# Patient Record
Sex: Female | Born: 1985 | Race: White | Hispanic: No | Marital: Single | Smoking: Never smoker
Health system: Southern US, Community
[De-identification: ages and names within clinical notes are randomized; demographics above are authoritative.]

## PROBLEM LIST (undated history)

## (undated) ENCOUNTER — Ambulatory Visit: Admission: EM | Payer: BC Managed Care – PPO | Source: Home / Self Care

## (undated) DIAGNOSIS — C8370 Burkitt lymphoma, unspecified site: Secondary | ICD-10-CM

## (undated) HISTORY — DX: Burkitt lymphoma, unspecified site: C83.70

## (undated) HISTORY — PX: PARTIAL COLECTOMY: SHX5273

---
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
## 2007-03-18 ENCOUNTER — Other Ambulatory Visit: Admission: RE | Admit: 2007-03-18 | Discharge: 2007-03-18 | Payer: Self-pay | Admitting: Gynecology

## 2008-03-21 ENCOUNTER — Other Ambulatory Visit: Admission: RE | Admit: 2008-03-21 | Discharge: 2008-03-21 | Payer: Self-pay | Admitting: Gynecology

## 2011-01-25 NOTE — Discharge Summary (Signed)
NAME:  Mikayla Salazar, Mikayla Salazar               ACCOUNT NO.:  [id number]   MEDICAL RECORD NO.:  [id number]          PATIENT TYPE:  INP   LOCATION:  6120                         FACILITY:  [facility]   PHYSICIAN:  Broadus John T. Pickard II, MDDATE OF BIRTH:  1986-02-20   DATE OF ADMISSION:  09/07/2004  DATE OF DISCHARGE:  09/10/2004                                 DISCHARGE SUMMARY   DISCHARGE DIAGNOSES:  1.  Right upper lobe/left lingular pneumonia.  2.  Nausea and vomiting secondary to diagnosis #1.  3.  High risk with associated psychosocial stress secondary to parents'      divorce.  4.  Dehydration.   PROCEDURE:  Chest x-ray showed right upper lobe and lingular pneumonia.   LABORATORY DATA:  None.   HOSPITAL COURSE:  A 25 year old white female admitted with fever and  decreased p.o. intake and dehydration with chest x-ray suspicious for  pneumonia.  The patient was treated with ceftriaxone x2 days.  Patient's  fever, nausea and vomiting improved with IV fluids and IV antibiotics.  Patient was symptomatically improved by the day of discharge.   TREATMENT:  The patient received inpatient ceftriaxone, IV fluids and p.r.n.  Phenergan and Zofran.   DISCHARGE MEDICATIONS:  1.  Omnicef 600 mg p.o. daily x7 days.  2.  Tylenol and Motrin p.r.n.   FOLLOW UP:  The patient is instructed to follow up with Dr. Hewitt Blade  office Thursday, September 13, 2004, at 10:40 in the morning.  We would  appreciate if Dr. Peter Congo would follow the patient's resolution of her  pneumonia as well as potentially arrange any outpatient child psychology  appointments with Dr. Lindie Spruce given her parents' recent divorce, her father's  possible drug abuse, her possible social activity with a 16 year old man,  etc. and her marijuana use.       WTP/MEDQ  D:  09/10/2004  T:  09/10/2004  Job:  [id number]

## 2015-11-07 ENCOUNTER — Other Ambulatory Visit: Payer: Self-pay | Admitting: Family Medicine

## 2015-11-07 DIAGNOSIS — E042 Nontoxic multinodular goiter: Secondary | ICD-10-CM

## 2015-11-20 ENCOUNTER — Ambulatory Visit
Admission: RE | Admit: 2015-11-20 | Discharge: 2015-11-20 | Disposition: A | Discharge status: Home / Self Care | Payer: BLUE CROSS/BLUE SHIELD | Source: Ambulatory Visit | Attending: Family Medicine | Admitting: Family Medicine

## 2015-11-20 DIAGNOSIS — E042 Nontoxic multinodular goiter: Secondary | ICD-10-CM

## 2015-12-08 ENCOUNTER — Other Ambulatory Visit: Payer: BLUE CROSS/BLUE SHIELD

## 2015-12-08 ENCOUNTER — Ambulatory Visit
Admission: RE | Admit: 2015-12-08 | Discharge: 2015-12-08 | Disposition: A | Discharge status: Home / Self Care | Payer: BLUE CROSS/BLUE SHIELD | Source: Ambulatory Visit | Attending: Family Medicine | Admitting: Family Medicine

## 2015-12-08 ENCOUNTER — Other Ambulatory Visit: Payer: Self-pay | Admitting: Family Medicine

## 2015-12-08 DIAGNOSIS — R222 Localized swelling, mass and lump, trunk: Secondary | ICD-10-CM

## 2017-08-18 ENCOUNTER — Other Ambulatory Visit: Payer: Self-pay

## 2017-08-18 ENCOUNTER — Encounter: Payer: Self-pay | Admitting: Certified Nurse Midwife

## 2017-08-18 ENCOUNTER — Inpatient Hospital Stay (HOSPITAL_COMMUNITY): Payer: BLUE CROSS/BLUE SHIELD | Admitting: Anesthesiology

## 2017-08-18 ENCOUNTER — Encounter (HOSPITAL_COMMUNITY)
Admission: AD | Disposition: A | Discharge status: Home / Self Care | Payer: Self-pay | Source: Ambulatory Visit | Attending: Obstetrics and Gynecology

## 2017-08-18 ENCOUNTER — Inpatient Hospital Stay (HOSPITAL_COMMUNITY)
Admission: AD | Admit: 2017-08-18 | Discharge: 2017-08-21 | DRG: 786 | Disposition: A | Discharge status: Home / Self Care | Payer: BLUE CROSS/BLUE SHIELD | Source: Ambulatory Visit | Attending: Obstetrics and Gynecology | Admitting: Obstetrics and Gynecology

## 2017-08-18 DIAGNOSIS — O42913 Preterm premature rupture of membranes, unspecified as to length of time between rupture and onset of labor, third trimester: Secondary | ICD-10-CM | POA: Diagnosis present

## 2017-08-18 DIAGNOSIS — Z3A32 32 weeks gestation of pregnancy: Secondary | ICD-10-CM

## 2017-08-18 DIAGNOSIS — O328XX0 Maternal care for other malpresentation of fetus, not applicable or unspecified: Principal | ICD-10-CM | POA: Diagnosis present

## 2017-08-18 DIAGNOSIS — O321XX0 Maternal care for breech presentation, not applicable or unspecified: Secondary | ICD-10-CM | POA: Diagnosis present

## 2017-08-18 LAB — CBC
HCT: 43.7 % (ref 36.0–46.0)
HEMATOCRIT: 39.6 % (ref 36.0–46.0)
HEMOGLOBIN: 14.5 g/dL (ref 12.0–15.0)
Hemoglobin: 13 g/dL (ref 12.0–15.0)
MCH: 32.7 pg (ref 26.0–34.0)
MCH: 32.5 pg (ref 26.0–34.0)
MCHC: 33.2 g/dL (ref 30.0–36.0)
MCHC: 32.8 g/dL (ref 30.0–36.0)
MCV: 98.6 fL (ref 78.0–100.0)
MCV: 99 fL (ref 78.0–100.0)
PLATELETS: 356 10*3/uL (ref 150–400)
RBC: 4.43 MIL/uL (ref 3.87–5.11)
RBC: 4 MIL/uL (ref 3.87–5.11)
RDW: 13 % (ref 11.5–15.5)
RDW: 12.8 % (ref 11.5–15.5)
WBC: 26 10*3/uL — AB (ref 4.0–10.5)

## 2017-08-18 SURGERY — Surgical Case
Anesthesia: Spinal | Site: Abdomen | Wound class: Clean Contaminated

## 2017-08-18 MED ORDER — BUPIVACAINE IN DEXTROSE 0.75-8.25 % IT SOLN
INTRATHECAL | Status: DC | PRN
  Administered 2017-08-18: 1.4 mL via INTRATHECAL

## 2017-08-18 MED ORDER — SIMETHICONE 80 MG PO CHEW
80.0000 mg | CHEWABLE_TABLET | ORAL | Status: DC
  Administered 2017-08-18 – 2017-08-21 (×3): 80 mg via ORAL
  Filled 2017-08-18 (×3): qty 1

## 2017-08-18 MED ORDER — FENTANYL CITRATE (PF) 100 MCG/2ML IJ SOLN
INTRAMUSCULAR | Status: AC
  Filled 2017-08-18: qty 2

## 2017-08-18 MED ORDER — PRENATAL MULTIVITAMIN CH
1.0000 | ORAL_TABLET | Freq: Every day | ORAL | Status: DC
  Administered 2017-08-19 – 2017-08-20 (×2): 1 via ORAL
  Filled 2017-08-18 (×2): qty 1

## 2017-08-18 MED ORDER — LACTATED RINGERS IV SOLN
INTRAVENOUS | Status: DC | PRN
  Administered 2017-08-18: 07:00:00 via INTRAVENOUS

## 2017-08-18 MED ORDER — ONDANSETRON HCL 4 MG/2ML IJ SOLN
4.0000 mg | Freq: Four times a day (QID) | INTRAMUSCULAR | Status: DC
  Administered 2017-08-18 – 2017-08-19 (×3): 4 mg via INTRAVENOUS
  Filled 2017-08-18 (×3): qty 2

## 2017-08-18 MED ORDER — OXYCODONE HCL 5 MG PO TABS
10.0000 mg | ORAL_TABLET | ORAL | Status: DC | PRN
  Administered 2017-08-18 – 2017-08-21 (×9): 10 mg via ORAL
  Filled 2017-08-18 (×9): qty 2

## 2017-08-18 MED ORDER — SODIUM CHLORIDE 0.9 % IR SOLN
Status: DC | PRN
  Administered 2017-08-18: 1

## 2017-08-18 MED ORDER — OXYCODONE HCL 5 MG/5ML PO SOLN: 5.0000 mg | Freq: Once | ORAL | Status: DC | PRN

## 2017-08-18 MED ORDER — OXYTOCIN 10 UNIT/ML IJ SOLN
INTRAVENOUS | Status: DC | PRN
  Administered 2017-08-18: 40 [IU] via INTRAVENOUS

## 2017-08-18 MED ORDER — COCONUT OIL OIL: 1.0000 "application " | TOPICAL_OIL | Status: DC | PRN

## 2017-08-18 MED ORDER — KETOROLAC TROMETHAMINE 30 MG/ML IJ SOLN
INTRAMUSCULAR | Status: AC
  Filled 2017-08-18: qty 1

## 2017-08-18 MED ORDER — OXYTOCIN 40 UNITS IN LACTATED RINGERS INFUSION - SIMPLE MED: 2.5000 [IU]/h | INTRAVENOUS | Status: AC

## 2017-08-18 MED ORDER — IBUPROFEN 600 MG PO TABS
600.0000 mg | ORAL_TABLET | Freq: Four times a day (QID) | ORAL | Status: DC
  Administered 2017-08-18 – 2017-08-21 (×9): 600 mg via ORAL
  Filled 2017-08-18 (×11): qty 1

## 2017-08-18 MED ORDER — PHENYLEPHRINE 8 MG IN D5W 100 ML (0.08MG/ML) PREMIX OPTIME
INJECTION | INTRAVENOUS | Status: AC
  Filled 2017-08-18: qty 100

## 2017-08-18 MED ORDER — ONDANSETRON HCL 4 MG/2ML IJ SOLN
INTRAMUSCULAR | Status: DC | PRN
  Administered 2017-08-18: 4 mg via INTRAVENOUS

## 2017-08-18 MED ORDER — OXYCODONE HCL 5 MG PO TABS: 5.0000 mg | ORAL_TABLET | Freq: Once | ORAL | Status: DC | PRN

## 2017-08-18 MED ORDER — OXYTOCIN 10 UNIT/ML IJ SOLN
INTRAMUSCULAR | Status: AC
  Filled 2017-08-18: qty 4

## 2017-08-18 MED ORDER — TETANUS-DIPHTH-ACELL PERTUSSIS 5-2.5-18.5 LF-MCG/0.5 IM SUSP: 0.5000 mL | Freq: Once | INTRAMUSCULAR | Status: DC

## 2017-08-18 MED ORDER — FENTANYL CITRATE (PF) 100 MCG/2ML IJ SOLN
INTRAMUSCULAR | Status: DC | PRN
  Administered 2017-08-18: 20 ug via INTRATHECAL

## 2017-08-18 MED ORDER — KETOROLAC TROMETHAMINE 30 MG/ML IJ SOLN
30.0000 mg | Freq: Four times a day (QID) | INTRAMUSCULAR | Status: AC | PRN
  Administered 2017-08-18: 30 mg via INTRAMUSCULAR

## 2017-08-18 MED ORDER — MORPHINE SULFATE (PF) 0.5 MG/ML IJ SOLN
INTRAMUSCULAR | Status: AC
Start: 2017-08-18 — End: 2017-08-18
  Filled 2017-08-18: qty 10

## 2017-08-18 MED ORDER — MENTHOL 3 MG MT LOZG: 1.0000 | LOZENGE | OROMUCOSAL | Status: DC | PRN

## 2017-08-18 MED ORDER — ACETAMINOPHEN 325 MG PO TABS
650.0000 mg | ORAL_TABLET | ORAL | Status: DC | PRN
  Administered 2017-08-20: 650 mg via ORAL
  Filled 2017-08-18: qty 2

## 2017-08-18 MED ORDER — WITCH HAZEL-GLYCERIN EX PADS: 1.0000 "application " | MEDICATED_PAD | CUTANEOUS | Status: DC | PRN

## 2017-08-18 MED ORDER — MEPERIDINE HCL 25 MG/ML IJ SOLN: 6.2500 mg | INTRAMUSCULAR | Status: DC | PRN

## 2017-08-18 MED ORDER — PHENYLEPHRINE 8 MG IN D5W 100 ML (0.08MG/ML) PREMIX OPTIME
INJECTION | INTRAVENOUS | Status: DC | PRN
  Administered 2017-08-18: 60 ug/min via INTRAVENOUS

## 2017-08-18 MED ORDER — ONDANSETRON HCL 4 MG/2ML IJ SOLN
INTRAMUSCULAR | Status: AC
  Filled 2017-08-18: qty 2

## 2017-08-18 MED ORDER — SIMETHICONE 80 MG PO CHEW: 80.0000 mg | CHEWABLE_TABLET | ORAL | Status: DC | PRN

## 2017-08-18 MED ORDER — MORPHINE SULFATE (PF) 0.5 MG/ML IJ SOLN
INTRAMUSCULAR | Status: DC | PRN
  Administered 2017-08-18: .2 mg via INTRATHECAL

## 2017-08-18 MED ORDER — SIMETHICONE 80 MG PO CHEW
80.0000 mg | CHEWABLE_TABLET | Freq: Three times a day (TID) | ORAL | Status: DC
  Administered 2017-08-19 – 2017-08-21 (×7): 80 mg via ORAL
  Filled 2017-08-18 (×7): qty 1

## 2017-08-18 MED ORDER — LACTATED RINGERS IV SOLN
INTRAVENOUS | Status: DC
  Administered 2017-08-18: 16:00:00 via INTRAVENOUS

## 2017-08-18 MED ORDER — LACTATED RINGERS IV BOLUS (SEPSIS)
500.0000 mL | Freq: Once | INTRAVENOUS | Status: AC
  Administered 2017-08-18: 500 mL via INTRAVENOUS

## 2017-08-18 MED ORDER — SENNOSIDES-DOCUSATE SODIUM 8.6-50 MG PO TABS
2.0000 | ORAL_TABLET | ORAL | Status: DC
  Administered 2017-08-18 – 2017-08-19 (×2): 2 via ORAL
  Filled 2017-08-18 (×3): qty 2

## 2017-08-18 MED ORDER — KETOROLAC TROMETHAMINE 30 MG/ML IJ SOLN
30.0000 mg | Freq: Four times a day (QID) | INTRAMUSCULAR | Status: AC | PRN
  Administered 2017-08-18: 30 mg via INTRAVENOUS
  Filled 2017-08-18: qty 1

## 2017-08-18 MED ORDER — DIPHENHYDRAMINE HCL 25 MG PO CAPS: 25.0000 mg | ORAL_CAPSULE | Freq: Four times a day (QID) | ORAL | Status: DC | PRN

## 2017-08-18 MED ORDER — OXYCODONE HCL 5 MG PO TABS
5.0000 mg | ORAL_TABLET | ORAL | Status: DC | PRN
  Administered 2017-08-20 (×3): 5 mg via ORAL
  Filled 2017-08-18 (×3): qty 1

## 2017-08-18 MED ORDER — ZOLPIDEM TARTRATE 5 MG PO TABS: 5.0000 mg | ORAL_TABLET | Freq: Every evening | ORAL | Status: DC | PRN

## 2017-08-18 MED ORDER — ONDANSETRON HCL 4 MG/2ML IJ SOLN: 4.0000 mg | Freq: Four times a day (QID) | INTRAMUSCULAR | Status: DC | PRN

## 2017-08-18 MED ORDER — CEFAZOLIN SODIUM-DEXTROSE 2-3 GM-%(50ML) IV SOLR
INTRAVENOUS | Status: DC | PRN
  Administered 2017-08-18: 2 g via INTRAVENOUS

## 2017-08-18 MED ORDER — FENTANYL CITRATE (PF) 100 MCG/2ML IJ SOLN: 25.0000 ug | INTRAMUSCULAR | Status: DC | PRN

## 2017-08-18 MED ORDER — DIBUCAINE 1 % RE OINT: 1.0000 "application " | TOPICAL_OINTMENT | RECTAL | Status: DC | PRN

## 2017-08-18 SURGICAL SUPPLY — 38 items
ADH SKN CLS APL DERMABOND .7 (GAUZE/BANDAGES/DRESSINGS)
APL SKNCLS STERI-STRIP NONHPOA (GAUZE/BANDAGES/DRESSINGS) ×2
BENZOIN TINCTURE PRP APPL 2/3 (GAUZE/BANDAGES/DRESSINGS) ×4 IMPLANT
CHLORAPREP W/TINT 26ML (MISCELLANEOUS) ×3 IMPLANT
CLAMP CORD UMBIL (MISCELLANEOUS) IMPLANT
CLOSURE WOUND 1/2 X4 (GAUZE/BANDAGES/DRESSINGS) ×1
CLOTH BEACON ORANGE TIMEOUT ST (SAFETY) ×3 IMPLANT
CONTAINER PREFILL 10% NBF 15ML (MISCELLANEOUS) IMPLANT
DERMABOND ADVANCED (GAUZE/BANDAGES/DRESSINGS)
DERMABOND ADVANCED .7 DNX12 (GAUZE/BANDAGES/DRESSINGS) IMPLANT
DRSG OPSITE POSTOP 4X10 (GAUZE/BANDAGES/DRESSINGS) ×3 IMPLANT
ELECT REM PT RETURN 9FT ADLT (ELECTROSURGICAL) ×3
ELECTRODE REM PT RTRN 9FT ADLT (ELECTROSURGICAL) ×1 IMPLANT
EXTRACTOR VACUUM M CUP 4 TUBE (SUCTIONS) IMPLANT
EXTRACTOR VACUUM M CUP 4' TUBE (SUCTIONS)
GLOVE BIO SURGEON STRL SZ7.5 (GLOVE) ×3 IMPLANT
GLOVE BIOGEL PI IND STRL 7.0 (GLOVE) ×1 IMPLANT
GLOVE BIOGEL PI INDICATOR 7.0 (GLOVE) ×2
GOWN STRL REUS W/TWL LRG LVL3 (GOWN DISPOSABLE) ×6 IMPLANT
KIT ABG SYR 3ML LUER SLIP (SYRINGE) ×3 IMPLANT
NDL HYPO 25X5/8 SAFETYGLIDE (NEEDLE) ×1 IMPLANT
NEEDLE HYPO 25X5/8 SAFETYGLIDE (NEEDLE) ×3 IMPLANT
NS IRRIG 1000ML POUR BTL (IV SOLUTION) ×3 IMPLANT
PACK C SECTION WH (CUSTOM PROCEDURE TRAY) ×3 IMPLANT
PAD OB MATERNITY 4.3X12.25 (PERSONAL CARE ITEMS) ×3 IMPLANT
PENCIL SMOKE EVAC W/HOLSTER (ELECTROSURGICAL) ×3 IMPLANT
STRIP CLOSURE SKIN 1/2X4 (GAUZE/BANDAGES/DRESSINGS) ×1 IMPLANT
SUT CHROMIC 2 0 SH (SUTURE) ×2 IMPLANT
SUT MNCRL 0 VIOLET CTX 36 (SUTURE) ×4 IMPLANT
SUT MONOCRYL 0 CTX 36 (SUTURE) ×10
SUT PDS AB 0 CTX 60 (SUTURE) ×3 IMPLANT
SUT PLAIN 0 NONE (SUTURE) IMPLANT
SUT PLAIN 2 0 (SUTURE) ×3
SUT PLAIN 2 0 XLH (SUTURE) IMPLANT
SUT PLAIN ABS 2-0 CT1 27XMFL (SUTURE) IMPLANT
SUT VIC AB 4-0 KS 27 (SUTURE) ×3 IMPLANT
TOWEL OR 17X24 6PK STRL BLUE (TOWEL DISPOSABLE) ×3 IMPLANT
TRAY FOLEY BAG SILVER LF 14FR (SET/KITS/TRAYS/PACK) ×3 IMPLANT

## 2017-08-18 NOTE — MAU Note (Signed)
Pt presents to MAU with leaking of fluid and urge to push.

## 2017-08-18 NOTE — Anesthesia Preprocedure Evaluation (Addendum)
Anesthesia Evaluation  Patient identified by MRN, date of birth, ID band Patient awake    Reviewed: Allergy & Precautions, H&P , NPO status , Patient's Chart, lab work & pertinent test results  Airway Mallampati: II   Neck ROM: full    Dental no notable dental hx. (+) Teeth Intact   Pulmonary neg pulmonary ROS,    Pulmonary exam normal breath sounds clear to auscultation       Cardiovascular negative cardio ROS   Rhythm:regular Rate:Normal     Neuro/Psych    GI/Hepatic   Endo/Other    Renal/GU      Musculoskeletal   Abdominal (+) + obese,   Peds  Hematology   Anesthesia Other Findings   Reproductive/Obstetrics (+) Pregnancy Breech, in labor                            Anesthesia Physical Anesthesia Plan  ASA: II and emergent  Anesthesia Plan: Spinal   Post-op Pain Management:    Induction: Intravenous  PONV Risk Score and Plan: 2 and Ondansetron, Scopolamine patch - Pre-op, Treatment may vary due to age or medical condition and Dexamethasone  Airway Management Planned: Simple Face Mask, Natural Airway and Nasal Cannula  Additional Equipment:   Intra-op Plan:   Post-operative Plan:   Informed Consent: I have reviewed the patients History and Physical, chart, labs and discussed the procedure including the risks, benefits and alternatives for the proposed anesthesia with the patient or authorized representative who has indicated his/her understanding and acceptance.     Plan Discussed with: CRNA, Anesthesiologist and Surgeon  Anesthesia Plan Comments:        Anesthesia Quick Evaluation

## 2017-08-18 NOTE — Addendum Note (Signed)
Addendum  created 08/18/17 1644 by Alvy Bimler, CRNA   Sign clinical note

## 2017-08-18 NOTE — H&P (Signed)
Teodora D Martinique is a 31 y.o. female presenting for ROM about 3:30. Pregnancy uncomplicated. OB History    Gravida Para Term Preterm AB Living   1             SAB TAB Ectopic Multiple Live Births                   Family History:  Social History:      Maternal Diabetes: No Genetic Screening: Normal Maternal Ultrasounds/Referrals: Normal Fetal Ultrasounds or other Referrals:  None Maternal Substance Abuse:  No Significant Maternal Medications:  None Significant Maternal Lab Results:  None Other Comments:  None  ROS Maternal Medical History:  Reason for admission: Rupture of membranes and contractions.       Last menstrual period 12/31/2016. Maternal Exam:  Abdomen: Fetal presentation: breech     Fetal Exam Fetal State Assessment: Category I - tracings are normal.     Physical Exam  Cardiovascular: Normal rate.  Respiratory: Effort normal.  GI: Soft.   Cx feet in vagina Prenatal labs: ABO, Rh:   Antibody:   Rubella:   RPR:    HBsAg:    HIV:    GBS:     Assessment/Plan: 31 yo at 36 6/7 weeks with PPROM, labor and breech Emergent cesarean section   Shon Millet II 08/18/2017, 6:46 AM

## 2017-08-18 NOTE — MAU Provider Note (Signed)
Interval progress note:  Patient presents to MAU at [phone] with complaints of contractions and rupture of membrane since 0330. Pt denies vaginal bleeding and problems during this pregnancy.  Upon entering the room at [phone]. Cervical exam: Complete, footling breech with foot present past introitus. Patient feeling urge to push.  Dr Kennon Rounds and Dr Gaetano Net called to MAU. STAT C/S called by Dr Gaetano Net. NICU informed.

## 2017-08-18 NOTE — Anesthesia Postprocedure Evaluation (Signed)
Anesthesia Post Note  Patient: Mikayla Salazar  Procedure(s) Performed: CESAREAN SECTION (N/A Abdomen)     Patient location during evaluation: PACU Anesthesia Type: Spinal Level of consciousness: oriented and awake and alert Pain management: pain level controlled Vital Signs Assessment: post-procedure vital signs reviewed and stable Respiratory status: spontaneous breathing, respiratory function stable and patient connected to nasal cannula oxygen Cardiovascular status: blood pressure returned to baseline and stable Postop Assessment: no headache, no backache and no apparent nausea or vomiting Anesthetic complications: no    Last Vitals:  Vitals:   08/18/17 0845 08/18/17 0900  BP: 102/63   Pulse: 91 80  Resp: 17 15  Temp:    SpO2: 100%     Last Pain:  Vitals:   08/18/17 0830  TempSrc:   PainSc: 0-No pain   Pain Goal:                 [name]

## 2017-08-18 NOTE — Anesthesia Postprocedure Evaluation (Signed)
Anesthesia Post Note  Patient: Mikayla Salazar  Procedure(s) Performed: CESAREAN SECTION (N/A Abdomen)     Patient location during evaluation: [facility] Anesthesia Type: Spinal Level of consciousness: oriented and awake and alert Pain management: pain level controlled Vital Signs Assessment: post-procedure vital signs reviewed and stable Respiratory status: spontaneous breathing, respiratory function stable and patient connected to nasal cannula oxygen Cardiovascular status: blood pressure returned to baseline and stable Postop Assessment: no headache, no backache and no apparent nausea or vomiting Anesthetic complications: no    Last Vitals:  Vitals:   08/18/17 1357 08/18/17 1550  BP: 115/70 107/63  Pulse: (!) 107 99  Resp: 18 18  Temp: 36.9 C 36.8 C  SpO2: 97% 98%    Last Pain:  Vitals:   08/18/17 1553  TempSrc:   PainSc: 4    Pain Goal: Patients Stated Pain Goal: 4 (08/18/17 1553)               Gilmer Mor

## 2017-08-18 NOTE — Op Note (Signed)
NAME:  Mikayla Salazar, Mikayla Salazar               ACCOUNT NO.:  [id number]  MEDICAL RECORD NO.:  [id number]  LOCATION:  [facility]                          FACILITY:  [facility]  PHYSICIAN:  Daleen Bo. Gaetano Net, M.D. DATE OF BIRTH:  Jan 19, 1986  DATE OF PROCEDURE:  08/18/2017 DATE OF DISCHARGE:                              OPERATIVE REPORT   PREOPERATIVE DIAGNOSES: 1. Intrauterine pregnancy at 32-6/7th weeks. 2. Double footling breech. 3. Complete dilation.  POSTOPERATIVE DIAGNOSES: 1. Intrauterine pregnancy at 32-6/7th weeks. 2. Double footling breech. 3. Complete dilation.  PROCEDURE:  Emergent cesarean section with a vertical extension.  SURGEON:  Daleen Bo. Gaetano Net, M.D.  ANESTHESIA:  Spinal, Lyn Hollingshead, M.D.  ESTIMATED BLOOD LOSS:  419 mL.  FINDINGS:  Viable female infant.  Apgars, arterial cord pH, birth weight pending.  SPECIMENS:  Placenta to Pathology.  INDICATIONS AND CONSENT:  The patient is a 31 year old patient in her first pregnancy at 32-6/7th weeks.  It has to this point been uncomplicated.  She presents to MAU with a history of rupture of membranes at approximately 3:30 a.m. today.  Examination in the MAU reveals the feet to be in the vagina, almost at the introitus, and the patient very soon got a strong urge to push.  She was advised the need for emergent cesarean section and was taken emergently to the operating room.  DESCRIPTION OF PROCEDURE:  The patient arrives in the operating room, was identified, and Dr. Jillyn Hidden placed a spinal anesthetic.  She was then placed in the dorsal supine position with 15-degree wedge.  She was prepped both abdominally and at the introitus with Betadine and a Foley catheter was placed.  She was draped in a sterile fashion.  A time-out was undertaken.  After assuring adequate spinal anesthesia, skin was entered through a Pfannenstiel incision and dissection was carried out to the peritoneum, which was bluntly perforated and extended  bluntly. Vesicouterine peritoneum was taken down cephalad laterally.  Uterus was incised in a low transverse manner and the uterine incision was extended cephalad laterally with the fingers.  The delivering hand reveals that the fetal pelvis was basically almost in the vagina and I cannot get my hand below the baby's abdomen.  Therefore, the bandage scissors were used to carefully extend the incision in the midline in a classical fashion.  This allows me to deliver the fetal head and then I can extract the baby by the shoulders and the baby was successfully delivered.  Good cry and tone were noted at delivery.  Cord was clamped and cut and the baby was handed to awaiting Pediatrics team.  Uterus was exteriorized.  Placenta was delivered and sent to Pathology.  Uterine cavity was noted to be clean.  The classical extension of the uterine incision was closed in 2 running locking imbricating layers of 0 Monocryl suture, which achieves good hemostasis.  Tubes and ovaries appeared normal.  The transverse incision on the uterus was then closed in 2 running locking imbricating layers of 0 Monocryl suture, which also achieves good hemostasis.  Uterus was returned to the vagina.  Lavage was carried out and good hemostasis was again noted.  The anterior peritoneum was closed in a  running fashion with 0 Monocryl suture which was also used to reapproximate the pyramidalis muscle in the midline. Anterior rectus fascia was closed in a running fashion with a 0 looped PDS suture.  Subcutaneous layer was closed with interrupted plain and the skin was closed in a subcuticular fashion with 4-0 Vicryl on a Keith needle.  Steri-Strips and dressings were applied.  All counts were correct.  The patient was taken to the recovery room in stable condition.     Daleen Bo Gaetano Net, M.D.     JET/MEDQ  D:  08/18/2017  T:  08/18/2017  Job:  [id number]

## 2017-08-18 NOTE — Anesthesia Procedure Notes (Addendum)
Spinal  Start time: 08/18/2017 6:55 AM End time: 08/18/2017 6:56 AM Staffing Anesthesiologist: Lyn Hollingshead, MD Preanesthetic Checklist Completed: patient identified, surgical consent, pre-op evaluation, timeout performed, IV checked, risks and benefits discussed and monitors and equipment checked Spinal Block Patient position: sitting Prep: site prepped and draped and DuraPrep Patient monitoring: continuous pulse ox and blood pressure Location: L3-4 Injection technique: single-shot Needle Needle type: Pencan  Needle length: 10 cm Needle insertion depth: 5 cm Assessment Sensory level: T6

## 2017-08-18 NOTE — Transfer of Care (Signed)
Immediate Anesthesia Transfer of Care Note  Patient: Mikayla Salazar  Procedure(s) Performed: CESAREAN SECTION (N/A Abdomen)  Patient Location: PACU  Anesthesia Type:Spinal  Level of Consciousness: awake  Airway & Oxygen Therapy: Patient Spontanous Breathing  Post-op Assessment: Report given to RN  Post vital signs: Reviewed and stable  Last Vitals:  Vitals:   08/18/17 0642  BP: 120/82  Pulse: 96  Resp: 20    Last Pain:  Vitals:   08/18/17 0635  PainSc: 10-Worst pain ever         Complications: No apparent anesthesia complications

## 2017-08-18 NOTE — Consult Note (Addendum)
Neonatology Note:   Attendance at C-section:    I was asked by Dr. Gaetano Net to attend this Stat C/S at 36 6/7 weeks due to footling breech presentation and preterm labor. The mother is a G1P0 O neg, Rubella non-immune, GBS not done yet. 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 ROM 3.5 hours prior to delivery, fluid clear, with onset of preterm labor. Presented to MAU, stat C-section performed. At delivery, arms presented, turned and delivered vertex. Infant with good tone, eyes open, breathing, so allowed 45 seconds of delay in cord clamping. Needed only minimal bulb suctioning. Although she did not cry vigorously, she had good air exchange on auscultation and maintained normal HR and pink color throughout, in room air. Pulse oximetry showed O2 sat in the low 90s by 4 minutes. Ap 9/9. The baby was shown to her mother, then transported to NICU for further care.   Real Cons, MD

## 2017-08-18 NOTE — Brief Op Note (Signed)
08/18/2017  7:44 AM  PATIENT:  Ciclaly D Martinique  31 y.o. female  PRE-OPERATIVE DIAGNOSIS:  preterm, complete, and breech  POST-OPERATIVE DIAGNOSIS:  preterm, complete, and breech  PROCEDURE:  Procedure(s): CESAREAN SECTION (N/A)vertical extension  SURGEON:  Surgeon(s) and Role:    * Everlene Farrier, MD - Primary  PHYSICIAN ASSISTANT:   ASSISTANTS: none   ANESTHESIA:   spinal  EBL:  419 mL   BLOOD ADMINISTERED:none  DRAINS: Urinary Catheter (Foley)   LOCAL MEDICATIONS USED:  NONE  SPECIMEN:  Source of Specimen:  placenta  DISPOSITION OF SPECIMEN:  PATHOLOGY  COUNTS:  YES  TOURNIQUET:  * No tourniquets in log *  DICTATION: .Other Dictation: Dictation Number [id number]  PLAN OF CARE: Admit to inpatient   PATIENT DISPOSITION:  PACU - hemodynamically stable.   Delay start of Pharmacological VTE agent (>24hrs) due to surgical blood loss or risk of bleeding: not applicable

## 2017-08-19 ENCOUNTER — Encounter (HOSPITAL_COMMUNITY): Payer: Self-pay

## 2017-08-19 LAB — CBC
HCT: 35.8 % — ABNORMAL LOW (ref 36.0–46.0)
Hemoglobin: 11.7 g/dL — ABNORMAL LOW (ref 12.0–15.0)
MCH: 32.4 pg (ref 26.0–34.0)
MCHC: 32.7 g/dL (ref 30.0–36.0)
MCV: 99.2 fL (ref 78.0–100.0)
PLATELETS: 321 10*3/uL (ref 150–400)
RBC: 3.61 MIL/uL — AB (ref 3.87–5.11)
RDW: 13.1 % (ref 11.5–15.5)
WBC: 23.9 10*3/uL — AB (ref 4.0–10.5)

## 2017-08-19 MED ORDER — ONDANSETRON HCL 4 MG PO TABS
8.0000 mg | ORAL_TABLET | Freq: Three times a day (TID) | ORAL | Status: DC | PRN
  Administered 2017-08-19 – 2017-08-20 (×3): 8 mg via ORAL
  Filled 2017-08-19 (×3): qty 2

## 2017-08-19 NOTE — Progress Notes (Signed)
Patient doing well No complaints.  Baby in NICU  BP 98/64 (BP Location: Left Arm)   Pulse 93   Temp 98.7 F (37.1 C) (Oral)   Resp 20   Ht 5\' 5"  (1.651 m)   Wt 76.2 kg (168 lb)   LMP 12/31/2016   SpO2 99%   BMI 27.96 kg/m  Results for orders placed or performed during the hospital encounter of 08/18/17 (from the past 24 hour(s))  CBC     Status: Abnormal   Collection Time: 08/19/17  5:50 AM  Result Value Ref Range   WBC 23.9 (H) 4.0 - 10.5 K/uL   RBC 3.61 (L) 3.87 - 5.11 MIL/uL   Hemoglobin 11.7 (L) 12.0 - 15.0 g/dL   HCT 35.8 (L) 36.0 - 46.0 %   MCV 99.2 78.0 - 100.0 fL   MCH 32.4 26.0 - 34.0 pg   MCHC 32.7 30.0 - 36.0 g/dL   RDW 13.1 11.5 - 15.5 %   Platelets 321 150 - 400 K/uL   Abdomen is soft and non tender Bandage clean and dry  POD # 1  Doing well Routine care

## 2017-08-20 MED ORDER — ONDANSETRON HCL 4 MG/2ML IJ SOLN: 4.0000 mg | Freq: Three times a day (TID) | INTRAMUSCULAR | Status: DC | PRN

## 2017-08-20 MED ORDER — MEASLES, MUMPS & RUBELLA VAC ~~LOC~~ INJ
0.5000 mL | INJECTION | Freq: Once | SUBCUTANEOUS | Status: DC
  Filled 2017-08-20 (×2): qty 0.5

## 2017-08-20 MED ORDER — NALOXONE HCL 0.4 MG/ML IJ SOLN: 0.4000 mg | INTRAMUSCULAR | Status: DC | PRN

## 2017-08-20 MED ORDER — SODIUM CHLORIDE 0.9% FLUSH: 3.0000 mL | INTRAVENOUS | Status: DC | PRN

## 2017-08-20 MED ORDER — SCOPOLAMINE 1 MG/3DAYS TD PT72
1.0000 | MEDICATED_PATCH | Freq: Once | TRANSDERMAL | Status: DC
  Filled 2017-08-20: qty 1

## 2017-08-20 MED ORDER — DIPHENHYDRAMINE HCL 25 MG PO CAPS: 25.0000 mg | ORAL_CAPSULE | ORAL | Status: DC | PRN

## 2017-08-20 MED ORDER — NALBUPHINE HCL 10 MG/ML IJ SOLN: 5.0000 mg | Freq: Once | INTRAMUSCULAR | Status: DC | PRN

## 2017-08-20 MED ORDER — DIPHENHYDRAMINE HCL 50 MG/ML IJ SOLN: 12.5000 mg | INTRAMUSCULAR | Status: DC | PRN

## 2017-08-20 MED ORDER — NALOXONE HCL 0.4 MG/ML IJ SOLN
1.0000 ug/kg/h | INTRAMUSCULAR | Status: DC | PRN
  Filled 2017-08-20: qty 5

## 2017-08-20 MED ORDER — NALBUPHINE HCL 10 MG/ML IJ SOLN: 5.0000 mg | INTRAMUSCULAR | Status: DC | PRN

## 2017-08-20 NOTE — Progress Notes (Signed)
Subjective: Postpartum Day 2: Cesarean Delivery Patient reports tolerating PO, + flatus and no problems voiding.    Objective: Vital signs in last 24 hours: Temp:  [98.1 F (36.7 C)-98.6 F (37 C)] 98.2 F (36.8 C) (12/12 0545) Pulse Rate:  [80-101] 80 (12/12 0545) Resp:  [20] 20 (12/12 0545) BP: (96-105)/(55-72) 98/61 (12/12 0545) SpO2:  [99 %-100 %] 100 % (12/12 0545) Weight:  [76.2 kg (167 lb 15.9 oz)] 76.2 kg (167 lb 15.9 oz) (12/12 3833)  Physical Exam:  General: alert, cooperative and appears stated age 7: appropriate Uterine Fundus: firm Incision: healing well, no significant drainage, no dehiscence, no significant erythema DVT Evaluation: No evidence of DVT seen on physical exam. Negative Homan's sign. No cords or calf tenderness. No significant calf/ankle edema.  Recent Labs    08/18/17 0802 08/19/17 0550  HGB 13.0 11.7*  HCT 39.6 35.8*    Assessment/Plan: Status post Cesarean section. Doing well postoperatively.  Continue current care. Baby girl "Earnest Bailey" in the NICU doing well.    Tyson Dense 08/20/2017, 8:17 AM

## 2017-08-20 NOTE — Lactation Note (Signed)
This note was copied from a baby's chart. Lactation Consultation Note  Patient Name: Mikayla Salazar Today's Date: 08/20/2017 Reason for consult: Initial assessment;NICU baby;Preterm <34wks   Initial consult with mom of NICU infant. Mom was holding infant in the NICU currently.   Mom reports she is pumping every 3 hours. She was able to bring infant a few gtts earlier. Mom reports she did not pump much yesterday but is doing better today.   Enc mom to pump 8-12 x in 24 hours for 15-20 minutes. Enc mom to follow pumping with hand expression. Mom reports she was taught to hand express.   Mom reports she has an [employer] pump at home, she is planning to get new tubings. Discussed pump rentals through the hospital if needed. Discussed with mom the NICU pumping rooms.   Mom reports she has no questions/concerns at this time. Enc mom to call with any questions/concerns.   Providing Milk for Your Baby in NICU Booklet left in mom's room. Enc mom to review pumping schedule and storage of breast milk in the NICU infant. [facility] Brochure given, mom informed of IP/OP services.      Maternal Data Formula Feeding for Exclusion: No Has patient been taught Hand Expression?: Yes  Feeding Feeding Type: Donor Breast Milk Length of feed: 30 min  LATCH Score                   Interventions    Lactation Tools Discussed/Used WIC Program: No Pump Review: Setup, frequency, and cleaning;Milk Storage Initiated by:: Reviewed and encouraged 8-12 x in 24 hours   Consult Status Consult Status: Follow-up Date: 08/21/17 Follow-up type: In-patient    Debby Freiberg Carissa Musick 08/20/2017, 4:45 PM

## 2017-08-21 LAB — RPR: RPR: NONREACTIVE

## 2017-08-21 MED ORDER — OXYCODONE-ACETAMINOPHEN 5-325 MG PO TABS: 1.0000 | ORAL_TABLET | ORAL | 0 refills | Status: DC | PRN

## 2017-08-21 MED ORDER — IBUPROFEN 600 MG PO TABS: 600.0000 mg | ORAL_TABLET | Freq: Four times a day (QID) | ORAL | 0 refills | Status: DC

## 2017-08-21 NOTE — Discharge Summary (Signed)
Obstetric Discharge Summary Reason for Admission: onset of labor Prenatal Procedures: ultrasound Intrapartum Procedures: c-section, T incision Postpartum Procedures: none Complications-Operative and Postpartum: none Hemoglobin  Date Value Ref Range Status  08/19/2017 11.7 (L) 12.0 - 15.0 g/dL Final   HCT  Date Value Ref Range Status  08/19/2017 35.8 (L) 36.0 - 46.0 % Final    Physical Exam:  General: alert and cooperative Lochia: appropriate Uterine Fundus: firm Incision: healing well, no significant drainage DVT Evaluation: No evidence of DVT seen on physical exam.  Discharge Diagnoses: Preterm labor  Discharge Information: Date: 08/21/2017 Activity: pelvic rest Diet: routine Medications: PNV, Ibuprofen and Percocet Condition: stable Instructions: refer to practice specific booklet Discharge to: home [place] Schedule an appointment as soon as possible for a visit in 2 week(s).   Contact information: [address] [place] [address] [phone]           Newborn Data: Live born female  Birth Weight: 3 lb 12.7 oz (1720 g) APGAR: 9, 100  Newborn Delivery   Birth date/time:  08/18/2017 07:04:00 Delivery type:  C-Section, Low Transverse C-section categorization:  Primary     Home with mother.  Marylynn Pearson 08/21/2017, 8:47 AM

## 2017-08-21 NOTE — Progress Notes (Signed)
Patient screened out for psychosocial assessment since none of the following apply:  Psychosocial stressors documented in mother or baby's chart  Gestation less than 32 weeks  Code at delivery   Infant with anomalies Please contact the Clinical Social Worker if specific needs arise, or by MOB's request.  Laurey Arrow, MSW, LCSW Clinical Social Work [phone]

## 2017-08-21 NOTE — Lactation Note (Signed)
Lactation Consultation Note  Patient Name: Mikayla Salazar [id number] Date: 08/21/2017   Mother wanted help with hand expression. Mother plans to rent DEBP from [employer]. Reviewed hands on pumping.  Recommend pumping q 3 hours. Suggest she call if she would like further assistance. Discussed engorgement care.     Maternal Data    Feeding    LATCH Score                   Interventions    Lactation Tools Discussed/Used     Consult Status      Vivianne Master [facility] 08/21/2017, 12:24 PM

## 2017-08-22 LAB — TYPE AND SCREEN
ABO/RH(D): O NEG
ANTIBODY SCREEN: POSITIVE
UNIT DIVISION: 0
Unit division: 0

## 2017-08-22 LAB — BPAM RBC
BLOOD PRODUCT EXPIRATION DATE: 201812272359
Blood Product Expiration Date: 201812282359
UNIT TYPE AND RH: 9500
Unit Type and Rh: 9500

## 2017-09-15 ENCOUNTER — Ambulatory Visit: Payer: Self-pay

## 2017-09-15 NOTE — Lactation Note (Signed)
This note was copied from a baby's chart. Lactation Consultation Note  Patient Name: Mikayla Salazar Today's Date: 09/15/2017 Reason for consult: Follow-up assessment;Other (Comment);Preterm <34wks;NICU baby(78 weeks old now , low milk supply - see LC note )  LC called to be informed mom requesting LC assist.  LC reviewed breast massage, hand express, 2-3 drops noted, and the importance of sandwiching the  [place] for the baby to obtain a deep latch.  Baby awake, and LC reviewed basics of latching / cross cradle and after several attempts baby latched with depth, no swallows noted.  Baby released due to lack of flow.  Per  Mom milk came in initially and she was engorged for several days.  The most milk she has ever pumped off in one day is 45 ml.  Has been pumping every 3 hours. LC  Discussed with mom that there is a low milk supply challenge  And recommended - continuing to pump both breast every 2 - 3 hours around  The clock. Also add one power pump a day.( 20 mins on 10 min off over 60 mins or 10 mins on off over 60 mins)  Consider adding Mothers Love plus herbs and follow directions as the bottle for recommended dose.  Mother informed of post-discharge support and given phone number to the lactation department, including services for phone call assistance; out-patient appointments; and breastfeeding support group. List of other breastfeeding resources in the community given in the handout. Encouraged mother to call for problems or concerns related to breastfeeding.   Maternal Data Has patient been taught Hand Expression?: Yes(few drops noted )  Feeding Feeding Type: Breast Fed Nipple Type: Slow - flow Length of feed: 25 min  LATCH Score Latch: Repeated attempts needed to sustain latch, nipple held in mouth throughout feeding, stimulation needed to elicit sucking reflex.  Audible Swallowing: None  Type of Nipple: Everted at rest and after stimulation  Comfort (Breast/Nipple):  Soft / non-tender  Hold (Positioning): Assistance needed to correctly position infant at breast and maintain latch.  LATCH Score: 6  Interventions Interventions: Breast feeding basics reviewed;Assisted with latch;Breast massage;Hand express;Adjust position;Support pillows;Position options  Lactation Tools Discussed/Used     Consult Status Consult Status: PRN Follow-up type: Other (comment)(in NICU )    Jerlyn Ly [facility] 09/15/2017, 10:23 AM

## 2017-12-30 ENCOUNTER — Encounter (HOSPITAL_COMMUNITY): Payer: Self-pay

## 2020-10-09 DIAGNOSIS — Z20822 Contact with and (suspected) exposure to covid-19: Secondary | ICD-10-CM | POA: Diagnosis not present

## 2020-12-06 DIAGNOSIS — N926 Irregular menstruation, unspecified: Secondary | ICD-10-CM | POA: Diagnosis not present

## 2020-12-06 DIAGNOSIS — R350 Frequency of micturition: Secondary | ICD-10-CM | POA: Diagnosis not present

## 2020-12-06 DIAGNOSIS — M545 Low back pain, unspecified: Secondary | ICD-10-CM | POA: Diagnosis not present

## 2020-12-06 DIAGNOSIS — R102 Pelvic and perineal pain: Secondary | ICD-10-CM | POA: Diagnosis not present

## 2020-12-06 DIAGNOSIS — R69 Illness, unspecified: Secondary | ICD-10-CM | POA: Diagnosis not present

## 2020-12-06 DIAGNOSIS — N760 Acute vaginitis: Secondary | ICD-10-CM | POA: Diagnosis not present

## 2020-12-06 DIAGNOSIS — Z113 Encounter for screening for infections with a predominantly sexual mode of transmission: Secondary | ICD-10-CM | POA: Diagnosis not present

## 2021-02-24 DIAGNOSIS — J0100 Acute maxillary sinusitis, unspecified: Secondary | ICD-10-CM | POA: Diagnosis not present

## 2021-03-27 DIAGNOSIS — Z3202 Encounter for pregnancy test, result negative: Secondary | ICD-10-CM | POA: Diagnosis not present

## 2021-03-27 DIAGNOSIS — M79605 Pain in left leg: Secondary | ICD-10-CM | POA: Diagnosis not present

## 2021-03-27 DIAGNOSIS — R69 Illness, unspecified: Secondary | ICD-10-CM | POA: Diagnosis not present

## 2021-03-27 DIAGNOSIS — Z113 Encounter for screening for infections with a predominantly sexual mode of transmission: Secondary | ICD-10-CM | POA: Diagnosis not present

## 2021-03-27 DIAGNOSIS — R102 Pelvic and perineal pain: Secondary | ICD-10-CM | POA: Diagnosis not present

## 2021-03-27 DIAGNOSIS — N760 Acute vaginitis: Secondary | ICD-10-CM | POA: Diagnosis not present

## 2021-05-03 ENCOUNTER — Ambulatory Visit (INDEPENDENT_AMBULATORY_CARE_PROVIDER_SITE_OTHER): Payer: Self-pay | Admitting: Nurse Practitioner

## 2021-05-03 ENCOUNTER — Other Ambulatory Visit: Payer: Self-pay

## 2021-05-03 ENCOUNTER — Encounter: Payer: Self-pay | Admitting: Nurse Practitioner

## 2021-05-03 VITALS — BP 111/73 | HR 64 | Temp 97.8°F | Ht 65.0 in | Wt 163.4 lb

## 2021-05-03 DIAGNOSIS — Z9049 Acquired absence of other specified parts of digestive tract: Secondary | ICD-10-CM

## 2021-05-03 DIAGNOSIS — Z7689 Persons encountering health services in other specified circumstances: Secondary | ICD-10-CM

## 2021-05-03 DIAGNOSIS — N941 Unspecified dyspareunia: Secondary | ICD-10-CM | POA: Diagnosis not present

## 2021-05-03 DIAGNOSIS — R87612 Low grade squamous intraepithelial lesion on cytologic smear of cervix (LGSIL): Secondary | ICD-10-CM | POA: Insufficient documentation

## 2021-05-03 DIAGNOSIS — G629 Polyneuropathy, unspecified: Secondary | ICD-10-CM

## 2021-05-03 DIAGNOSIS — Z8579 Personal history of other malignant neoplasms of lymphoid, hematopoietic and related tissues: Secondary | ICD-10-CM

## 2021-05-03 DIAGNOSIS — C8370 Burkitt lymphoma, unspecified site: Secondary | ICD-10-CM | POA: Insufficient documentation

## 2021-05-03 DIAGNOSIS — R895 Abnormal microbiological findings in specimens from other organs, systems and tissues: Secondary | ICD-10-CM | POA: Diagnosis not present

## 2021-05-03 DIAGNOSIS — N760 Acute vaginitis: Secondary | ICD-10-CM | POA: Diagnosis not present

## 2021-05-03 DIAGNOSIS — Z8572 Personal history of non-Hodgkin lymphomas: Secondary | ICD-10-CM

## 2021-05-03 DIAGNOSIS — R309 Painful micturition, unspecified: Secondary | ICD-10-CM | POA: Diagnosis not present

## 2021-05-03 DIAGNOSIS — G43909 Migraine, unspecified, not intractable, without status migrainosus: Secondary | ICD-10-CM | POA: Insufficient documentation

## 2021-05-03 NOTE — Progress Notes (Signed)
New Patient Office Visit  Subjective:  Patient ID: Mikayla Salazar, female    DOB: 19-Jun-1986  Age: 35 y.o. MRN: [id number]  CC:  Chief Complaint  Patient presents with   New Patient (Initial Visit)    HPI Mikayla Salazar presents to establish new primary care provider.  She states she has been having some intermittent numbness in both feet.  Happening mostly at night.  Unsure of cause.  Very she may have developed issue with her blood sugar.  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  This was diagnosed at 35 years of age.  Had to have partial colectomy.  As result of chemotherapy she did develop mild foot drop on the left side.  This has been stable.  She does have a GYN provider.  She currently takes norethindrone to help with regulation of menstrual periods.  She is due to have routine fasting blood work.  She is also due for annual wellness visit.  History reviewed. No pertinent past medical history.  Past Surgical History:  Procedure Laterality Date   CESAREAN SECTION N/A 08/18/2017   Procedure: CESAREAN SECTION;  Surgeon: Everlene Farrier, MD;  Location: [place];  Service: Obstetrics;  Laterality: N/A;    History reviewed. No pertinent family history.  Social History   Socioeconomic History   Marital status: Single    Spouse name: Not on file   Number of children: Not on file   Years of education: Not on file   Highest education level: Not on file  Occupational History   Not on file  Tobacco Use   Smoking status: Never   Smokeless tobacco: Never  Substance and Sexual Activity   Alcohol use: No   Drug use: No   Sexual activity: Yes  Other Topics Concern   Not on file  Social History Narrative   Not on file   Social Determinants of Health   Financial Resource Strain: Not on file  Food Insecurity: Not on file  Transportation Needs: Not on file  Physical Activity: Not on file  Stress: Not on file  Social Connections: Not on file  Intimate  Partner Violence: Not on file    ROS Review of Systems  Constitutional:  Negative for activity change, appetite change, chills, fatigue and fever.  HENT:  Negative for congestion, postnasal drip, rhinorrhea, sinus pressure, sinus pain, sneezing and sore throat.   Eyes: Negative.   Respiratory:  Negative for cough, chest tightness, shortness of breath and wheezing.   Cardiovascular:  Negative for chest pain and palpitations.  Gastrointestinal:  Negative for abdominal pain, constipation, diarrhea, nausea and vomiting.  Endocrine: Negative for cold intolerance, heat intolerance, polydipsia and polyuria.  Genitourinary:  Negative for dyspareunia, dysuria, flank pain, frequency and urgency.  Musculoskeletal:  Negative for arthralgias, back pain and myalgias.       History of foot drop bilaterally, worse on the left side.  Skin:  Negative for rash.  Allergic/Immunologic: Negative for environmental allergies.  Neurological:  Positive for numbness. Negative for dizziness, weakness and headaches.       Has been developing some numbness of both feet affecting her mostly at night.  Hematological:  Negative for adenopathy.  Psychiatric/Behavioral:  The patient is not nervous/anxious.    Objective:   Today's Vitals   05/03/21 1441  BP: 111/73  Pulse: 64  Temp: 97.8 F (36.6 C)  SpO2: 98%  Weight: 163 lb 6.4 oz (74.1 kg)  Height: '5\' 5"'$  (1.651 m)  Body mass index is 27.19 kg/m.   Physical Exam Vitals and nursing note reviewed.  Constitutional:      Appearance: Normal appearance. She is well-developed.  HENT:     Head: Normocephalic and atraumatic.     Nose: Nose normal.     Mouth/Throat:     Mouth: Mucous membranes are moist.     Pharynx: Oropharynx is clear.  Eyes:     Pupils: Pupils are equal, round, and reactive to light.  Cardiovascular:     Rate and Rhythm: Normal rate and regular rhythm.     Pulses: Normal pulses.     Heart sounds: Normal heart sounds.  Pulmonary:      Effort: Pulmonary effort is normal.     Breath sounds: Normal breath sounds.  Abdominal:     Palpations: Abdomen is soft.  Musculoskeletal:        General: Normal range of motion.     Cervical back: Normal range of motion and neck supple.  Lymphadenopathy:     Cervical: No cervical adenopathy.  Skin:    General: Skin is warm and dry.     Capillary Refill: Capillary refill takes less than 2 seconds.  Neurological:     General: No focal deficit present.     Mental Status: She is alert and oriented to person, place, and time.  Psychiatric:        Mood and Affect: Mood normal.        Behavior: Behavior normal.        Thought Content: Thought content normal.        Judgment: Judgment normal.    Assessment & Plan:  1. Encounter to establish care Appointment today to establish new primary care provider.  2. History of Burkitt's lymphoma Patient with history of Burkitt's lymphoma at age of 30.  Did have partial colectomy and chemotherapy following surgery.  She is currently in remission.  3. History of partial colectomy Partial colectomy due to history of Burkitt's lymphoma at age of 59.  36. Peripheral polyneuropathy Patient developing some numbness of both feet affecting her mostly at night.  We will check lab work prior to next visit for further evaluation.  Problem List Items Addressed This Visit       Nervous and Auditory   Peripheral polyneuropathy     Other   Encounter to establish care - Primary   History of Burkitt's lymphoma   History of partial colectomy    This note was dictated using Dragon Voice Recognition Software. Rapid proofreading was performed to expedite the delivery of the information. Despite proofreading, phonetic errors will occur which are common with this voice recognition software. Please take this into consideration. If there are any concerns, please contact our office.    Follow-up: Return in about 13 days (around 05/16/2021) for health maintenance  exam, FBW a week prior to visit - add free T4, vitamin D, and B12 .   Ronnell Freshwater, NP

## 2021-05-10 ENCOUNTER — Other Ambulatory Visit: Payer: Self-pay

## 2021-05-10 ENCOUNTER — Other Ambulatory Visit: Payer: Medicaid Other

## 2021-05-10 DIAGNOSIS — Z0000 Encounter for general adult medical examination without abnormal findings: Secondary | ICD-10-CM | POA: Diagnosis not present

## 2021-05-10 NOTE — Addendum Note (Signed)
Addended by: Vivia Birmingham on: 05/10/2021 09:15 AM   Modules accepted: Orders

## 2021-05-11 LAB — T4, FREE: Free T4: 1.29 ng/dL (ref 0.82–1.77)

## 2021-05-11 LAB — VITAMIN B12: Vitamin B-12: 357 pg/mL (ref 232–1245)

## 2021-05-11 NOTE — Progress Notes (Signed)
Labs are good. Discuss with patient at visit 05/16/2021

## 2021-05-14 DIAGNOSIS — Z8572 Personal history of non-Hodgkin lymphomas: Secondary | ICD-10-CM | POA: Insufficient documentation

## 2021-05-14 DIAGNOSIS — Z9049 Acquired absence of other specified parts of digestive tract: Secondary | ICD-10-CM | POA: Insufficient documentation

## 2021-05-14 DIAGNOSIS — Z7689 Persons encountering health services in other specified circumstances: Secondary | ICD-10-CM | POA: Insufficient documentation

## 2021-05-14 DIAGNOSIS — Z8579 Personal history of other malignant neoplasms of lymphoid, hematopoietic and related tissues: Secondary | ICD-10-CM | POA: Insufficient documentation

## 2021-05-14 DIAGNOSIS — G629 Polyneuropathy, unspecified: Secondary | ICD-10-CM | POA: Insufficient documentation

## 2021-05-16 ENCOUNTER — Encounter: Payer: Self-pay | Admitting: Nurse Practitioner

## 2021-05-16 ENCOUNTER — Ambulatory Visit (INDEPENDENT_AMBULATORY_CARE_PROVIDER_SITE_OTHER): Payer: Self-pay | Admitting: Nurse Practitioner

## 2021-05-16 ENCOUNTER — Other Ambulatory Visit: Payer: Self-pay

## 2021-05-16 VITALS — BP 113/74 | HR 97 | Temp 98.3°F | Ht 65.0 in | Wt 162.2 lb

## 2021-05-16 DIAGNOSIS — Z6826 Body mass index (BMI) 26.0-26.9, adult: Secondary | ICD-10-CM

## 2021-05-16 DIAGNOSIS — Z0001 Encounter for general adult medical examination with abnormal findings: Secondary | ICD-10-CM

## 2021-05-16 DIAGNOSIS — Z8572 Personal history of non-Hodgkin lymphomas: Secondary | ICD-10-CM

## 2021-05-16 DIAGNOSIS — G629 Polyneuropathy, unspecified: Secondary | ICD-10-CM

## 2021-05-16 DIAGNOSIS — Z8579 Personal history of other malignant neoplasms of lymphoid, hematopoietic and related tissues: Secondary | ICD-10-CM

## 2021-05-16 NOTE — Patient Instructions (Addendum)
Health Maintenance, Female Adopting a healthy lifestyle and getting preventive care are important in promoting health and wellness. Ask your health care provider about: The right schedule for you to have regular tests and exams. Things you can do on your own to prevent diseases and keep yourself healthy. What should I know about diet, weight, and exercise? Eat a healthy diet  Eat a diet that includes plenty of vegetables, fruits, low-fat dairy products, and lean protein. Do not eat a lot of foods that are high in solid fats, added sugars, or sodium. Maintain a healthy weight Body mass index (BMI) is used to identify weight problems. It estimates body fat based on height and weight. Your health care provider can help determine your BMI and help you achieve or maintain a healthy weight. Get regular exercise Get regular exercise. This is one of the most important things you can do for your health. Most adults should: Exercise for at least 150 minutes each week. The exercise should increase your heart rate and make you sweat (moderate-intensity exercise). Do strengthening exercises at least twice a week. This is in addition to the moderate-intensity exercise. Spend less time sitting. Even light physical activity can be beneficial. Watch cholesterol and blood lipids Have your blood tested for lipids and cholesterol at 33 years of age, then have this test every 5 years. Have your cholesterol levels checked more often if: Your lipid or cholesterol levels are high. You are older than 33 years of age. You are at high risk for heart disease. What should I know about cancer screening? Depending on your health history and family history, you may need to have cancer screening at various ages. This may include screening for: Breast cancer. Cervical cancer. Colorectal cancer. Skin cancer. Lung cancer. What should I know about heart disease, diabetes, and high blood pressure? Blood pressure and heart  disease High blood pressure causes heart disease and increases the risk of stroke. This is more likely to develop in people who have high blood pressure readings, are of African descent, or are overweight. Have your blood pressure checked: Every 3-5 years if you are 18-39 years of age. Every year if you are 40 years old or older. Diabetes Have regular diabetes screenings. This checks your fasting blood sugar level. Have the screening done: Once every three years after age 40 if you are at a normal weight and have a low risk for diabetes. More often and at a younger age if you are overweight or have a high risk for diabetes. What should I know about preventing infection? Hepatitis B If you have a higher risk for hepatitis B, you should be screened for this virus. Talk with your health care provider to find out if you are at risk for hepatitis B infection. Hepatitis C Testing is recommended for: Everyone born from 1945 through 1965. Anyone with known risk factors for hepatitis C. Sexually transmitted infections (STIs) Get screened for STIs, including gonorrhea and chlamydia, if: You are sexually active and are younger than 33 years of age. You are older than 33 years of age and your health care provider tells you that you are at risk for this type of infection. Your sexual activity has changed since you were last screened, and you are at increased risk for chlamydia or gonorrhea. Ask your health care provider if you are at risk. Ask your health care provider about whether you are at high risk for HIV. Your health care provider may recommend a prescription medicine   to help prevent HIV infection. If you choose to take medicine to prevent HIV, you should first get tested for HIV. You should then be tested every 3 months for as long as you are taking the medicine. Pregnancy If you are about to stop having your period (premenopausal) and you may become pregnant, seek counseling before you get  pregnant. Take 400 to 800 micrograms (mcg) of folic acid every day if you become pregnant. Ask for birth control (contraception) if you want to prevent pregnancy. Osteoporosis and menopause Osteoporosis is a disease in which the bones lose minerals and strength with aging. This can result in bone fractures. If you are 83 years old or older, or if you are at risk for osteoporosis and fractures, ask your health care provider if you should: Be screened for bone loss. Take a calcium or vitamin D supplement to lower your risk of fractures. Be given hormone replacement therapy (HRT) to treat symptoms of menopause. Follow these instructions at home: Lifestyle Do not use any products that contain nicotine or tobacco, such as cigarettes, e-cigarettes, and chewing tobacco. If you need help quitting, ask your health care provider. Do not use street drugs. Do not share needles. Ask your health care provider for help if you need support or information about quitting drugs. Alcohol use Do not drink alcohol if: Your health care provider tells you not to drink. You are pregnant, may be pregnant, or are planning to become pregnant. If you drink alcohol: Limit how much you use to 0-1 drink a day. Limit intake if you are breastfeeding. Be aware of how much alcohol is in your drink. In the [place] one drink equals one 12 oz bottle of beer (355 mL), one 5 oz glass of wine (148 mL), or one 1 oz glass of hard liquor (44 mL). General instructions Schedule regular health, dental, and eye exams. Stay current with your vaccines. Tell your health care provider if: You often feel depressed. You have ever been abused or do not feel safe at home. Summary Adopting a healthy lifestyle and getting preventive care are important in promoting health and wellness. Follow your health care provider's instructions about healthy diet, exercising, and getting tested or screened for diseases. Follow your health care provider's  instructions on monitoring your cholesterol and blood pressure. This information is not intended to replace advice given to you by your health care provider. Make sure you discuss any questions you have with your health care provider. Document Revised: 11/03/2020 Document Reviewed: 08/19/2018 Elsevier Patient Education  2022 [facility]) Exercise Recommendation  Being physically active is important to prevent heart disease and stroke, the nation's No. 1and No. 5killers. To improve overall cardiovascular health, we suggest at least 150 minutes per week of moderate exercise or 75 minutes per week of vigorous exercise (or a combination of moderate and vigorous activity). Thirty minutes a day, five times a week is an easy goal to remember. You will also experience benefits even if you divide your time into two or three segments of 10 to 15 minutes per day.  For people who would benefit from lowering their blood pressure or cholesterol, we recommend 40 minutes of aerobic exercise of moderate to vigorous intensity three to four times a week to lower the risk for heart attack and stroke.  Physical activity is anything that makes you move your body and burn calories.  This includes things like climbing stairs or playing sports. Aerobic exercises benefit your  heart, and include walking, jogging, swimming or biking. Strength and stretching exercises are best for overall stamina and flexibility.  The simplest, positive change you can make to effectively improve your heart health is to start walking. It's enjoyable, free, easy, social and great exercise. A walking program is flexible and boasts high success rates because people can stick with it. It's easy for walking to become a regular and satisfying part of life.   For Overall Cardiovascular Health: At least 30 minutes of moderate-intensity aerobic activity at least 5 days per week for a total of 150  OR  At least 25  minutes of vigorous aerobic activity at least 3 days per week for a total of 75 minutes; or a combination of moderate- and vigorous-intensity aerobic activity  AND  Moderate- to high-intensity muscle-strengthening activity at least 2 days per week for additional health benefits.  For Lowering Blood Pressure and Cholesterol An average 40 minutes of moderate- to vigorous-intensity aerobic activity 3 or 4 times per week  What if I can't make it to the time goal? Something is always better than nothing! And everyone has to start somewhere. Even if you've been sedentary for years, today is the day you can begin to make healthy changes in your life. If you don't think you'll make it for 30 or 40 minutes, set a reachable goal for today. You can work up toward your overall goal by increasing your time as you get stronger. Don't let all-or-nothing thinking rob you of doing what you can every day.  Source:http://www.heart.org    Fat and Cholesterol Restricted Eating Plan Getting too much fat and cholesterol in your diet may cause health problems. Choosing the right foods helps keep your fat and cholesterol at normal levels. This can keep you from getting certain diseases. Your doctor may recommend an eating plan that includes: Total fat: ______% or less of total calories a day. Saturated fat: ______% or less of total calories a day. Cholesterol: less than _________mg a day. Fiber: ______g a day. What are tips for following this plan? Meal planning At meals, divide your plate into four equal parts: Fill one-half of your plate with vegetables and green salads. Fill one-fourth of your plate with whole grains. Fill one-fourth of your plate with low-fat (lean) protein foods. Eat fish that is high in omega-3 fats at least two times a week. This includes mackerel, tuna, sardines, and salmon. Eat foods that are high in fiber, such as whole grains, beans, apples, broccoli, carrots, peas, and  barley. General tips  Work with your doctor to lose weight if you need to. Avoid: Foods with added sugar. Fried foods. Foods with partially hydrogenated oils. Limit alcohol intake to no more than 1 drink a day for nonpregnant women and 2 drinks a day for men. One drink equals 12 oz of beer, 5 oz of wine, or 1 oz of hard liquor. Reading food labels Check food labels for: Trans fats. Partially hydrogenated oils. Saturated fat (g) in each serving. Cholesterol (mg) in each serving. Fiber (g) in each serving. Choose foods with healthy fats, such as: Monounsaturated fats. Polyunsaturated fats. Omega-3 fats. Choose grain products that have whole grains. Look for the word "whole" as the first word in the ingredient list. Cooking Cook foods using low-fat methods. These include baking, boiling, grilling, and broiling. Eat more home-cooked foods. Eat at restaurants and buffets less often. Avoid cooking using saturated fats, such as butter, cream, palm oil, palm kernel oil, and coconut oil.  Recommended foods Fruits All fresh, canned (in natural juice), or frozen fruits. Vegetables Fresh or frozen vegetables (raw, steamed, roasted, or grilled). Green salads. Grains Whole grains, such as whole wheat or whole grain breads, crackers, cereals, and pasta. Unsweetened oatmeal, bulgur, barley, quinoa, or brown rice. Corn or whole wheat flour tortillas. Meats and other protein foods Ground beef (85% or leaner), grass-fed beef, or beef trimmed of fat. Skinless chicken or [place]. Ground chicken or [place]. Pork trimmed of fat. All fish and seafood. Egg whites. Dried beans, peas, or lentils. Unsalted nuts or seeds. Unsalted canned beans. Nut butters without added sugar or oil. Dairy Low-fat or nonfat dairy products, such as skim or 1% milk, 2% or reduced-fat cheeses, low-fat and fat-free ricotta or cottage cheese, or plain low-fat and nonfat yogurt. Fats and oils Tub margarine without trans fats. Light  or reduced-fat mayonnaise and salad dressings. Avocado. Olive, canola, sesame, or safflower oils. The items listed above may not be a complete list of foods and beverages you can eat. Contact a dietitian for more information. Foods to avoid Fruits Canned fruit in heavy syrup. Fruit in cream or butter sauce. Fried fruit. Vegetables Vegetables cooked in cheese, cream, or butter sauce. Fried vegetables. Grains White bread. White pasta. White rice. Cornbread. Bagels, pastries, and croissants. Crackers and snack foods that contain trans fat and hydrogenated oils. Meats and other protein foods Fatty cuts of meat. Ribs, chicken wings, bacon, sausage, bologna, salami, chitterlings, fatback, hot dogs, bratwurst, and packaged lunch meats. Liver and organ meats. Whole eggs and egg yolks. Chicken and [place] with skin. Fried meat. Dairy Whole or 2% milk, cream, half-and-half, and cream cheese. Whole milk cheeses. Whole-fat or sweetened yogurt. Full-fat cheeses. Nondairy creamers and whipped toppings. Processed cheese, cheese spreads, and cheese curds. Beverages Alcohol. Sugar-sweetened drinks such as sodas, lemonade, and fruit drinks. Fats and oils Butter, stick margarine, lard, shortening, ghee, or bacon fat. Coconut, palm kernel, and palm oils. Sweets and desserts Corn syrup, sugars, honey, and molasses. Candy. Jam and jelly. Syrup. Sweetened cereals. Cookies, pies, cakes, donuts, muffins, and ice cream. The items listed above may not be a complete list of foods and beverages you should avoid. Contact a dietitian for more information. Summary Choosing the right foods helps keep your fat and cholesterol at normal levels. This can keep you from getting certain diseases. At meals, fill one-half of your plate with vegetables and green salads. Eat high-fiber foods, like whole grains, beans, apples, carrots, peas, and barley. Limit added sugar, saturated fats, alcohol, and fried foods. This information is  not intended to replace advice given to you by your health care provider. Make sure you discuss any questions you have with your health care provider. Document Revised: 12/29/2019 Document Reviewed: 12/29/2019 Elsevier Patient Education  2022 [employer].

## 2021-05-16 NOTE — Progress Notes (Signed)
Mikayla Salazar  Established Patient Office Visit  Subjective:  Patient ID: Mikayla Salazar, female    DOB: 1986/07/01  Age: 35 y.o. MRN: [id number]  CC:                                    Chief Complaint  Patient presents with   Annual Exam    HPI Mikayla Salazar presents for annual wellness visit.  She continues to have peripheral neuropathy in both feet.  Did check B12 level and free T4 which were both normal.  She does need to have routine, fasting blood work done.  She does see GYN provider for well woman exams.  States that she feels dizzy sometimes.  Dizziness mostly when she sits from a lying position or stands from a sitting position.  Passes after a few seconds.  She denies other concerns or complaints today. She denies chest pain, chest pressure, or shortness of breath. She denies headaches or visual disturbances. She denies abdominal pain, nausea, vomiting, or changes in bowel or bladder habits.    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 No pertinent family history.  Social History   Socioeconomic History   Marital status: Single    Spouse name: Not on file   Number of children: Not on file   Years of education: Not on file   Highest education level: Not on file  Occupational History   Not on file  Tobacco Use   Smoking status: Never   Smokeless tobacco: Never  Substance and Sexual Activity   Alcohol use: No   Drug use: No   Sexual activity: Yes  Other Topics Concern   Not on file  Social History Narrative   Not on file   Social Determinants of Health   Financial Resource Strain: Not on file  Food Insecurity: Not on file  Transportation Needs: Not on file  Physical Activity: Not  on file  Stress: Not on file  Social Connections: Not on file  Intimate Partner Violence: Not on file    Outpatient Medications Prior to Visit  Medication Sig Dispense Refill   norethindrone (MICRONOR) 0.35 MG tablet Take 1 tablet by mouth daily.     No facility-administered medications prior to visit.    No Known Allergies  ROS Review of Systems  Constitutional:  Negative for activity change, appetite change, chills, fatigue and fever.  HENT:  Negative for congestion, postnasal drip, rhinorrhea, sinus pressure, sinus pain, sneezing and sore throat.   Eyes: Negative.   Respiratory:  Negative for cough, chest tightness, shortness of breath and wheezing.   Cardiovascular:  Negative for chest pain and palpitations.  Gastrointestinal:  Negative for abdominal pain, constipation, diarrhea, nausea and vomiting.  Endocrine: Negative for cold intolerance, heat intolerance, polydipsia and polyuria.  Genitourinary:  Negative for dyspareunia, dysuria, flank pain, frequency and urgency.  Musculoskeletal:  Negative for arthralgias, back pain and myalgias.  Skin:  Negative for rash.  Allergic/Immunologic: Negative for environmental allergies.  Neurological:  Positive for numbness. Negative for dizziness, weakness and headaches.       Patient has some numbness and  tingling in both lower extremities.  Hematological:  Negative for adenopathy.  Psychiatric/Behavioral:  The patient is not nervous/anxious.      Objective:    Physical Exam Vitals and nursing note reviewed.  Constitutional:      Appearance: Normal appearance. She is well-developed.  HENT:     Head: Normocephalic and atraumatic.     Right Ear: Tympanic membrane, ear canal and external ear normal.     Left Ear: Tympanic membrane, ear canal and external ear normal.     Nose: Nose normal.     Mouth/Throat:     Mouth: Mucous membranes are moist.  Eyes:     Extraocular Movements: Extraocular movements intact.      Conjunctiva/sclera: Conjunctivae normal.     Pupils: Pupils are equal, round, and reactive to light.  Cardiovascular:     Rate and Rhythm: Normal rate and regular rhythm.     Pulses: Normal pulses.     Heart sounds: Normal heart sounds.  Pulmonary:     Effort: Pulmonary effort is normal.     Breath sounds: Normal breath sounds.  Abdominal:     General: Bowel sounds are normal.     Palpations: Abdomen is soft.  Musculoskeletal:        General: Normal range of motion.     Cervical back: Normal range of motion and neck supple.  Lymphadenopathy:     Cervical: No cervical adenopathy.  Skin:    General: Skin is warm and dry.     Capillary Refill: Capillary refill takes 2 to 3 seconds.  Neurological:     General: No focal deficit present.     Mental Status: She is alert and oriented to person, place, and time.  Psychiatric:        Mood and Affect: Mood normal.        Behavior: Behavior normal.        Thought Content: Thought content normal.        Judgment: Judgment normal.    Today's Vitals   05/16/21 1522  BP: 113/74  Pulse: 97  Temp: 98.3 F (36.8 C)  SpO2: 100%  Weight: 162 lb 3.2 oz (73.6 kg)  Height: 5' 5"  (1.651 m)   Body mass index is 26.99 kg/m.   Wt Readings from Last 3 Encounters:  05/23/21 162 lb (73.5 kg)  05/16/21 162 lb 3.2 oz (73.6 kg)  05/03/21 163 lb 6.4 oz (74.1 kg)     Health Maintenance Due  Topic Date Due   HIV Screening  Never done   Hepatitis C Screening  Never done   TETANUS/TDAP  Never done   PAP SMEAR-Modifier  Never done   COVID-19 Vaccine (3 - Pfizer risk series) 08/18/2020   INFLUENZA VACCINE  Never done    There are no preventive care reminders to display for this patient.  Lab Results  Component Value Date   TSH 1.140 05/18/2021   Lab Results  Component Value Date   WBC 11.7 (H) 05/18/2021   HGB 14.2 05/18/2021   HCT 42.0 05/18/2021   MCV 94 05/18/2021   PLT 374 05/18/2021   Lab Results  Component Value Date   NA 138  05/18/2021   K 4.6 05/18/2021   CO2 24 05/18/2021   GLUCOSE 95 05/18/2021   BUN 15 05/18/2021   CREATININE 0.64 05/18/2021   BILITOT 0.4 05/18/2021   ALKPHOS 87 05/18/2021   AST 17 05/18/2021   ALT 15 05/18/2021   PROT 6.7 05/18/2021  ALBUMIN 4.8 05/18/2021   CALCIUM 9.5 05/18/2021   EGFR 120 05/18/2021   Lab Results  Component Value Date   CHOL 136 05/18/2021   Lab Results  Component Value Date   HDL 50 05/18/2021   Lab Results  Component Value Date   LDLCALC 78 05/18/2021   Lab Results  Component Value Date   TRIG 32 05/18/2021   Lab Results  Component Value Date   CHOLHDL 2.7 05/18/2021   Lab Results  Component Value Date   HGBA1C 5.8 (H) 05/18/2021      Assessment & Plan:  1. Encounter for general adult medical examination with abnormal findings Annual health maintenance exam today.  We will get routine, fasting labs and discuss results with patient when results are available. - Comprehensive metabolic panel; Future - TSH; Future - Lipid panel; Future - CBC with Differential/Platelet; Future - Hemoglobin A1c; Future - Hemoglobin A1c - CBC with Differential/Platelet - Lipid panel - TSH - Comprehensive metabolic panel  2. Peripheral polyneuropathy Unclear etiology.  Will check ANA with reflex, RA factor, sed rate, CBC, and BMP.  Discussed results with patient when results are available. - ANA w/Reflex if Positive; Future - Rheumatoid factor; Future - Sedimentation rate; Future - Hemoglobin A1c; Future - Hemoglobin A1c - Sedimentation rate - Rheumatoid factor - ANA w/Reflex if Positive  3. 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  No problems.  Will monitor.  4. Body mass index 26.0-26.9, adult Advised patient to limit calorie intake to 1500 cal or less per day.  She should consume a low-fat, low-cholesterol diet and incorporate exercise into her daily routine.  Problem List Items  Addressed This Visit       Nervous and Auditory   Peripheral polyneuropathy   Relevant Orders   ANA w/Reflex if Positive   Rheumatoid factor   Sedimentation rate   Hemoglobin A1c     Other   History of Burkitt's lymphoma   Encounter for general adult medical examination with abnormal findings - Primary   Relevant Orders   Comprehensive metabolic panel   TSH   Lipid panel   CBC with Differential/Platelet   Hemoglobin A1c   Body mass index 26.0-26.9, adult   This note was dictated using Dragon Voice Recognition Software. Rapid proofreading was performed to expedite the delivery of the information. Despite proofreading, phonetic errors will occur which are common with this voice recognition software. Please take this into consideration. If there are any concerns, please contact our office.    Follow-up: Return in 1 year (on 05/16/2022) for health maintenance exam - patient to make appointment for friday morning to have labs .    Ronnell Freshwater, NP

## 2021-05-18 ENCOUNTER — Telehealth: Payer: Self-pay | Admitting: Nurse Practitioner

## 2021-05-18 ENCOUNTER — Other Ambulatory Visit: Payer: Medicaid Other

## 2021-05-18 ENCOUNTER — Other Ambulatory Visit: Payer: Self-pay

## 2021-05-18 DIAGNOSIS — Z13228 Encounter for screening for other metabolic disorders: Secondary | ICD-10-CM | POA: Diagnosis not present

## 2021-05-18 DIAGNOSIS — Z130 Encounter for screening for diseases of the blood and blood-forming organs and certain disorders involving the immune mechanism: Secondary | ICD-10-CM | POA: Diagnosis not present

## 2021-05-18 DIAGNOSIS — Z1321 Encounter for screening for nutritional disorder: Secondary | ICD-10-CM | POA: Diagnosis not present

## 2021-05-18 DIAGNOSIS — E569 Vitamin deficiency, unspecified: Secondary | ICD-10-CM

## 2021-05-18 DIAGNOSIS — Z1329 Encounter for screening for other suspected endocrine disorder: Secondary | ICD-10-CM | POA: Diagnosis not present

## 2021-05-18 NOTE — Addendum Note (Signed)
Addended by: Vivia Birmingham on: 05/18/2021 09:26 AM   Modules accepted: Orders

## 2021-05-18 NOTE — Telephone Encounter (Signed)
Patient called in to get Vitamin D results and I don't see any results.  Patient is here today to get labs done.  Can we add the Vitamin D in with these lab test?  Please Advise.

## 2021-05-18 NOTE — Addendum Note (Signed)
Addended by: Aron Baba on: 05/18/2021 09:19 AM   Modules accepted: Orders

## 2021-05-18 NOTE — Telephone Encounter (Signed)
Patient came into the office today and received blood work

## 2021-05-18 NOTE — Telephone Encounter (Signed)
Yes please. How do we add that in?

## 2021-05-19 LAB — COMPREHENSIVE METABOLIC PANEL
ALT: 15 IU/L (ref 0–32)
AST: 17 IU/L (ref 0–40)
Albumin/Globulin Ratio: 2.5 — ABNORMAL HIGH (ref 1.2–2.2)
Albumin: 4.8 g/dL (ref 3.8–4.8)
Alkaline Phosphatase: 87 IU/L (ref 44–121)
BUN/Creatinine Ratio: 23 (ref 9–23)
BUN: 15 mg/dL (ref 6–20)
Bilirubin Total: 0.4 mg/dL (ref 0.0–1.2)
CO2: 24 mmol/L (ref 20–29)
Calcium: 9.5 mg/dL (ref 8.7–10.2)
Chloride: 100 mmol/L (ref 96–106)
Creatinine, Ser: 0.64 mg/dL (ref 0.57–1.00)
Globulin, Total: 1.9 g/dL (ref 1.5–4.5)
Glucose: 95 mg/dL (ref 65–99)
Potassium: 4.6 mmol/L (ref 3.5–5.2)
Sodium: 138 mmol/L (ref 134–144)
Total Protein: 6.7 g/dL (ref 6.0–8.5)
eGFR: 120 mL/min/{1.73_m2} (ref >59)

## 2021-05-19 LAB — LIPID PANEL
Chol/HDL Ratio: 2.7 ratio (ref 0.0–4.4)
Cholesterol, Total: 136 mg/dL (ref 100–199)
HDL: 50 mg/dL (ref >39)
LDL Chol Calc (NIH): 78 mg/dL (ref 0–99)
Triglycerides: 32 mg/dL (ref 0–149)
VLDL Cholesterol Cal: 8 mg/dL (ref 5–40)

## 2021-05-19 LAB — CBC WITH DIFFERENTIAL/PLATELET
Basophils Absolute: 0 10*3/uL (ref 0.0–0.2)
Basos: 0 %
EOS (ABSOLUTE): 0.1 10*3/uL (ref 0.0–0.4)
Eos: 1 %
Hematocrit: 42 % (ref 34.0–46.6)
Hemoglobin: 14.2 g/dL (ref 11.1–15.9)
Immature Grans (Abs): 0 10*3/uL (ref 0.0–0.1)
Immature Granulocytes: 0 %
Lymphocytes Absolute: 1.6 10*3/uL (ref 0.7–3.1)
Lymphs: 13 %
MCH: 31.9 pg (ref 26.6–33.0)
MCHC: 33.8 g/dL (ref 31.5–35.7)
MCV: 94 fL (ref 79–97)
Monocytes Absolute: 0.9 10*3/uL (ref 0.1–0.9)
Monocytes: 7 %
Neutrophils Absolute: 9.1 10*3/uL — ABNORMAL HIGH (ref 1.4–7.0)
Neutrophils: 79 %
Platelets: 374 10*3/uL (ref 150–450)
RBC: 4.45 x10E6/uL (ref 3.77–5.28)
RDW: 12.1 % (ref 11.7–15.4)
WBC: 11.7 10*3/uL — ABNORMAL HIGH (ref 3.4–10.8)

## 2021-05-19 LAB — SEDIMENTATION RATE: Sed Rate: 4 mm/hr (ref 0–32)

## 2021-05-19 LAB — T3: T3, Total: 119 ng/dL (ref 71–180)

## 2021-05-19 LAB — ANA W/REFLEX IF POSITIVE: Anti Nuclear Antibody (ANA): NEGATIVE

## 2021-05-19 LAB — VITAMIN D 25 HYDROXY (VIT D DEFICIENCY, FRACTURES): Vit D, 25-Hydroxy: 31.9 ng/mL (ref 30.0–100.0)

## 2021-05-19 LAB — RHEUMATOID FACTOR: Rheumatoid fact SerPl-aCnc: 10.3 IU/mL (ref <14.0)

## 2021-05-19 LAB — HEMOGLOBIN A1C
Est. average glucose Bld gHb Est-mCnc: 120 mg/dL
Hgb A1c MFr Bld: 5.8 % — ABNORMAL HIGH (ref 4.8–5.6)

## 2021-05-19 LAB — TSH: TSH: 1.14 u[IU]/mL (ref 0.450–4.500)

## 2021-05-21 ENCOUNTER — Other Ambulatory Visit: Payer: Self-pay | Admitting: Nurse Practitioner

## 2021-05-21 ENCOUNTER — Other Ambulatory Visit: Payer: Self-pay

## 2021-05-21 ENCOUNTER — Telehealth: Payer: Self-pay | Admitting: Nurse Practitioner

## 2021-05-21 DIAGNOSIS — H109 Unspecified conjunctivitis: Secondary | ICD-10-CM

## 2021-05-21 MED ORDER — POLYMYXIN B-TRIMETHOPRIM 10000-0.1 UNIT/ML-% OP SOLN: OPHTHALMIC | 0 refills | Status: DC

## 2021-05-21 MED ORDER — POLYMYXIN B-TRIMETHOPRIM 10000-0.1 UNIT/ML-% OP SOLN
OPHTHALMIC | 0 refills | Status: DC
Start: 2021-05-21 — End: 2022-09-30

## 2021-05-21 NOTE — Telephone Encounter (Signed)
RX sent to [facility] per patient request. AS, CMA

## 2021-05-21 NOTE — Telephone Encounter (Signed)
Patient requesting medication for pink eye. 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    [facility].

## 2021-05-21 NOTE — Telephone Encounter (Signed)
Sent polytrim eye drops into [facility]. Insert 1 drop into both eyes every 2 to 3 hours while awake for next 7 days. Make sure you don't touch eyes with bare hands wash your hands anytime they are near your face.

## 2021-05-21 NOTE — Progress Notes (Signed)
Sent polytrim eye drops into [facility]. Insert 1 drop into both eyes every 2 to 3 hours while awake for next 7 days. Make sure you don't touch eyes with bare hands wash your hands anytime they are near your face.

## 2021-05-23 ENCOUNTER — Ambulatory Visit (INDEPENDENT_AMBULATORY_CARE_PROVIDER_SITE_OTHER): Payer: Self-pay | Admitting: Nurse Practitioner

## 2021-05-23 ENCOUNTER — Encounter: Payer: Self-pay | Admitting: Nurse Practitioner

## 2021-05-23 VITALS — Ht 65.0 in | Wt 162.0 lb

## 2021-05-23 DIAGNOSIS — R7301 Impaired fasting glucose: Secondary | ICD-10-CM

## 2021-05-23 DIAGNOSIS — Z6826 Body mass index (BMI) 26.0-26.9, adult: Secondary | ICD-10-CM

## 2021-05-23 DIAGNOSIS — G629 Polyneuropathy, unspecified: Secondary | ICD-10-CM

## 2021-05-23 NOTE — Progress Notes (Signed)
Virtual Visit via Telephone Note  I connected with Mikayla Salazar on 06/03/21 at 11:30 AM EDT by telephone and verified that I am speaking with the correct person using two identifiers.  Location: Patient: work Provider:  primary care at [facility]     I discussed the limitations, risks, security and privacy concerns of performing an evaluation and management service by telephone and the availability of in person appointments. I also discussed with the patient that there may be a patient responsible charge related to this service. The patient expressed understanding and agreed to proceed.   History of Present Illness: Patient presents for follow-up visit to review recent lab results.  She did have an elevated hemoglobin A1c at 5.8.  Blood sugar was normal.  Connective tissue panel was normal.  She did have a mildly elevated white blood cell count at 11.7, but was also treated for bacterial conjunctivitis at that time.  All other labs were within normal limits.  She has no new concerns or complaints today.   Observations/Objective:  The patient is alert and oriented. She is pleasant and answers all questions appropriately. Breathing is non-labored. She is in no acute distress at this time.    Today's Vitals   05/23/21 1005  Weight: 162 lb (73.5 kg)  Height: '5\' 5"'$  (1.651 m)   Body mass index is 26.96 kg/m.   Assessment and Plan: 1. Impaired fasting glucose Reviewed labs showing normal glucose with elevated hemoglobin A1c at 5.8.  Advised her to limit intake of sugar and carbohydrates.  Encouraged her to incorporate exercise into her daily routine.  We will recheck hemoglobin A1c at next visit.  2. Peripheral polyneuropathy Unclear etiology.  Connective tissue panel was within normal limits.  We will consider vascular ultrasound of the lower extremities in the future if symptoms worsen or are persistent.  3. Body mass index 26.0-26.9, adult Encouraged patient to keep her  daily caloric intake to 1500 cal or less per day.  She should consume a low-fat, low-cholesterol diet.  Encouraged her to incorporate exercise into her daily routine.  Follow Up Instructions:  This note was dictated using Systems analyst. Rapid proofreading was performed to expedite the delivery of the information. Despite proofreading, phonetic errors will occur which are common with this voice recognition software. Please take this into consideration. If there are any concerns, please contact our office.    I discussed the assessment and treatment plan with the patient. The patient was provided an opportunity to ask questions and all were answered. The patient agreed with the plan and demonstrated an understanding of the instructions.   The patient was advised to call back or seek an in-person evaluation if the symptoms worsen or if the panel was normal fails to improve as anticipated.  I provided 10 minutes of non-face-to-face time during this encounter.   Ronnell Freshwater, NP

## 2021-05-27 ENCOUNTER — Encounter: Payer: Self-pay | Admitting: Nurse Practitioner

## 2021-05-27 DIAGNOSIS — Z0001 Encounter for general adult medical examination with abnormal findings: Secondary | ICD-10-CM | POA: Insufficient documentation

## 2021-05-27 DIAGNOSIS — Z6826 Body mass index (BMI) 26.0-26.9, adult: Secondary | ICD-10-CM | POA: Insufficient documentation

## 2021-06-03 DIAGNOSIS — R7301 Impaired fasting glucose: Secondary | ICD-10-CM | POA: Insufficient documentation

## 2021-07-31 DIAGNOSIS — Z3040 Encounter for surveillance of contraceptives, unspecified: Secondary | ICD-10-CM | POA: Diagnosis not present

## 2021-07-31 DIAGNOSIS — N760 Acute vaginitis: Secondary | ICD-10-CM | POA: Diagnosis not present

## 2021-07-31 DIAGNOSIS — Z6828 Body mass index (BMI) 28.0-28.9, adult: Secondary | ICD-10-CM | POA: Diagnosis not present

## 2021-07-31 DIAGNOSIS — Z01419 Encounter for gynecological examination (general) (routine) without abnormal findings: Secondary | ICD-10-CM | POA: Diagnosis not present

## 2021-10-01 ENCOUNTER — Encounter: Payer: Self-pay | Admitting: Nurse Practitioner

## 2021-10-01 ENCOUNTER — Other Ambulatory Visit: Payer: Self-pay

## 2021-10-01 ENCOUNTER — Ambulatory Visit (INDEPENDENT_AMBULATORY_CARE_PROVIDER_SITE_OTHER): Payer: BC Managed Care – PPO | Admitting: Nurse Practitioner

## 2021-10-01 VITALS — BP 131/72 | HR 88 | Temp 98.3°F | Ht 65.0 in | Wt 154.9 lb

## 2021-10-01 DIAGNOSIS — R7301 Impaired fasting glucose: Secondary | ICD-10-CM | POA: Diagnosis not present

## 2021-10-01 DIAGNOSIS — K219 Gastro-esophageal reflux disease without esophagitis: Secondary | ICD-10-CM | POA: Diagnosis not present

## 2021-10-01 DIAGNOSIS — R079 Chest pain, unspecified: Secondary | ICD-10-CM | POA: Diagnosis not present

## 2021-10-01 DIAGNOSIS — R319 Hematuria, unspecified: Secondary | ICD-10-CM

## 2021-10-01 DIAGNOSIS — L659 Nonscarring hair loss, unspecified: Secondary | ICD-10-CM

## 2021-10-01 DIAGNOSIS — E010 Iodine-deficiency related diffuse (endemic) goiter: Secondary | ICD-10-CM

## 2021-10-01 DIAGNOSIS — R5383 Other fatigue: Secondary | ICD-10-CM

## 2021-10-01 DIAGNOSIS — Z6826 Body mass index (BMI) 26.0-26.9, adult: Secondary | ICD-10-CM

## 2021-10-01 DIAGNOSIS — N390 Urinary tract infection, site not specified: Secondary | ICD-10-CM | POA: Diagnosis not present

## 2021-10-01 LAB — POCT URINALYSIS DIP (CLINITEK)
Bilirubin, UA: NEGATIVE
Glucose, UA: NEGATIVE mg/dL
Leukocytes, UA: NEGATIVE
Nitrite, UA: NEGATIVE
POC PROTEIN,UA: NEGATIVE
Spec Grav, UA: >=1.03 — AB (ref 1.010–1.025)
Urobilinogen, UA: 0.2 E.U./dL
pH, UA: 5.5 (ref 5.0–8.0)

## 2021-10-01 MED ORDER — OMEPRAZOLE 20 MG PO CPDR: 20.0000 mg | DELAYED_RELEASE_CAPSULE | Freq: Every day | ORAL | 3 refills | Status: DC
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
## 2021-10-02 LAB — COMPREHENSIVE METABOLIC PANEL
ALT: 17 IU/L (ref 0–32)
AST: 18 IU/L (ref 0–40)
Albumin/Globulin Ratio: 2.4 — ABNORMAL HIGH (ref 1.2–2.2)
Albumin: 4.8 g/dL (ref 3.8–4.8)
Alkaline Phosphatase: 69 IU/L (ref 44–121)
BUN/Creatinine Ratio: 24 — ABNORMAL HIGH (ref 9–23)
BUN: 17 mg/dL (ref 6–20)
Bilirubin Total: 0.4 mg/dL (ref 0.0–1.2)
CO2: 21 mmol/L (ref 20–29)
Calcium: 9.4 mg/dL (ref 8.7–10.2)
Chloride: 100 mmol/L (ref 96–106)
Creatinine, Ser: 0.7 mg/dL (ref 0.57–1.00)
Globulin, Total: 2 g/dL (ref 1.5–4.5)
Glucose: 102 mg/dL — ABNORMAL HIGH (ref 70–99)
Potassium: 4.4 mmol/L (ref 3.5–5.2)
Sodium: 139 mmol/L (ref 134–144)
Total Protein: 6.8 g/dL (ref 6.0–8.5)
eGFR: 117 mL/min/{1.73_m2} (ref >59)

## 2021-10-02 LAB — VITAMIN B12: Vitamin B-12: 336 pg/mL (ref 232–1245)

## 2021-10-02 LAB — VITAMIN D 25 HYDROXY (VIT D DEFICIENCY, FRACTURES): Vit D, 25-Hydroxy: 25.5 ng/mL — ABNORMAL LOW (ref 30.0–100.0)

## 2021-10-02 LAB — CBC
Hematocrit: 44.1 % (ref 34.0–46.6)
Hemoglobin: 14.9 g/dL (ref 11.1–15.9)
MCH: 31.5 pg (ref 26.6–33.0)
MCHC: 33.8 g/dL (ref 31.5–35.7)
MCV: 93 fL (ref 79–97)
Platelets: 371 10*3/uL (ref 150–450)
RBC: 4.73 x10E6/uL (ref 3.77–5.28)
RDW: 12.2 % (ref 11.7–15.4)
WBC: 6.7 10*3/uL (ref 3.4–10.8)

## 2021-10-02 LAB — TSH: TSH: 1.02 u[IU]/mL (ref 0.450–4.500)

## 2021-10-02 LAB — HEMOGLOBIN A1C
Est. average glucose Bld gHb Est-mCnc: 120 mg/dL
Hgb A1c MFr Bld: 5.8 % — ABNORMAL HIGH (ref 4.8–5.6)

## 2021-10-02 LAB — T4, FREE: Free T4: 1.49 ng/dL (ref 0.82–1.77)

## 2021-10-03 ENCOUNTER — Other Ambulatory Visit: Payer: Self-pay | Admitting: Nurse Practitioner

## 2021-10-03 ENCOUNTER — Telehealth: Payer: Self-pay | Admitting: Nurse Practitioner

## 2021-10-03 DIAGNOSIS — E559 Vitamin D deficiency, unspecified: Secondary | ICD-10-CM

## 2021-10-03 LAB — URINE CULTURE

## 2021-10-03 MED ORDER — ERGOCALCIFEROL 1.25 MG (50000 UT) PO CAPS: 50000.0000 [IU] | ORAL_CAPSULE | ORAL | 5 refills | Status: DC

## 2021-10-03 NOTE — Telephone Encounter (Signed)
Patient calling to inquire about lab results. AS, CMA

## 2021-10-03 NOTE — Telephone Encounter (Signed)
Please let the patient know that I have reviewed her labs. Her vitamin d was a little low. I have added Drisdol, which is high dose Vitamin d). This is taken once a week for the next few months. After that, she should take an over the counter vitamin d supplement daily. I noticed that her vitamin B12 is on low normal side. I recommend she take over the counter vitamin B12 supplement every day. Her HgbA1c is stable. Still 5.8, which is slightly higher than normal. She should limit intake of carbohydrates and sugar and increase intake of lean protein and green leafy vegetables. Her other labs looked good.  Thanks so much.   -HB

## 2021-10-03 NOTE — Progress Notes (Signed)
vitamin d was a little low. I have added Drisdol, which is high dose Vitamin d). This is taken once a week for the next few months. After that, she should take an over the counter vitamin d supplement daily. I noticed that her vitamin B12 is on low normal side. I recommend she take over the counter vitamin B12 supplement every day. Her HgbA1c is stable. Still 5.8, which is slightly higher than normal. She should limit intake of carbohydrates and sugar and increase intake of lean protein and green leafy vegetables. Her other labs looked good.

## 2021-10-03 NOTE — Telephone Encounter (Signed)
Called pt she is advised of labs results and recommendation and Rx was sent to pharmacy

## 2021-11-01 ENCOUNTER — Ambulatory Visit: Payer: BLUE CROSS/BLUE SHIELD | Admitting: Nurse Practitioner

## 2021-11-05 ENCOUNTER — Ambulatory Visit
Admission: RE | Admit: 2021-11-05 | Discharge: 2021-11-05 | Disposition: A | Discharge status: Home / Self Care | Payer: BC Managed Care – PPO | Source: Ambulatory Visit | Attending: Nurse Practitioner | Admitting: Nurse Practitioner

## 2021-11-05 DIAGNOSIS — E010 Iodine-deficiency related diffuse (endemic) goiter: Secondary | ICD-10-CM

## 2021-11-05 NOTE — Progress Notes (Signed)
Please let the patient know that the ultrasound of her thyroid was normal. Thanks so much.   -HB

## 2022-01-28 ENCOUNTER — Encounter: Payer: Self-pay | Admitting: Nurse Practitioner

## 2022-01-28 ENCOUNTER — Ambulatory Visit (INDEPENDENT_AMBULATORY_CARE_PROVIDER_SITE_OTHER): Payer: BC Managed Care – PPO | Admitting: Nurse Practitioner

## 2022-01-28 VITALS — BP 118/73 | HR 80 | Temp 98.3°F | Ht 64.96 in | Wt 157.4 lb

## 2022-01-28 DIAGNOSIS — R5383 Other fatigue: Secondary | ICD-10-CM | POA: Diagnosis not present

## 2022-01-28 DIAGNOSIS — E559 Vitamin D deficiency, unspecified: Secondary | ICD-10-CM

## 2022-01-28 DIAGNOSIS — E010 Iodine-deficiency related diffuse (endemic) goiter: Secondary | ICD-10-CM | POA: Diagnosis not present

## 2022-01-28 DIAGNOSIS — R7303 Prediabetes: Secondary | ICD-10-CM

## 2022-01-28 NOTE — Progress Notes (Signed)
Established patient visit   Patient: Mikayla Salazar   DOB: 1986-03-02   36 y.o. Female  MRN: [id number] Visit Date: 01/28/2022   Chief Complaint  Patient presents with   Follow-up   Subjective    HPI  Patient presenting for follow up visit.  -prediabetes - last HgbA1c 10/02/2021 was 5.8 - will want to recheck today.  -had normal thyroid ultrasound 10/30/2021  She feels as though she does have trouble with thyroid or blood sugar. States that she eats more than most people and her body weight doesn't seem to change. She gets irritable and has trouble concentrating. She states that she has trouble with insomnia sometimes.  She states that she has had covid twice and symptoms really started after she had covid 19 the second time.    Medications: Outpatient Medications Prior to Visit  Medication Sig   norethindrone (MICRONOR) 0.35 MG tablet Take 1 tablet by mouth daily.   ergocalciferol (DRISDOL) 1.25 MG (50000 UT) capsule Take 1 capsule (50,000 Units total) by mouth once a week. (Patient not taking: Reported on 01/28/2022)   omeprazole (PRILOSEC) 20 MG capsule Take 1 capsule (20 mg total) by mouth daily. (Patient not taking: Reported on 01/28/2022)   Secnidazole (SOLOSEC) 2 g PACK Solosec 2 gram oral DR granules in packet (Patient not taking: Reported on 01/28/2022)   trimethoprim-polymyxin b (POLYTRIM) ophthalmic solution Insert one drop into both eyes every 2 to 4 hours while awake for next 7 days (Patient not taking: Reported on 05/23/2021)   No facility-administered medications prior to visit.    Review of Systems  Constitutional:  Positive for appetite change and fatigue. Negative for activity change, chills and fever.       Appetite increase  HENT:  Negative for congestion, postnasal drip, rhinorrhea, sinus pressure, sinus pain, sneezing and sore throat.   Eyes: Negative.   Respiratory:  Negative for cough, chest tightness, shortness of breath and wheezing.   Cardiovascular:   Positive for palpitations. Negative for chest pain.       Intermittent episodes of palpitations   Gastrointestinal:  Negative for abdominal pain, constipation, diarrhea, nausea and vomiting.  Endocrine: Negative for cold intolerance, heat intolerance, polydipsia and polyuria.  Genitourinary:  Negative for dyspareunia, dysuria, flank pain, frequency and urgency.  Musculoskeletal:  Negative for arthralgias, back pain and myalgias.  Skin:  Negative for rash.  Allergic/Immunologic: Negative for environmental allergies.  Neurological:  Negative for dizziness, weakness and headaches.  Hematological:  Negative for adenopathy.  Psychiatric/Behavioral:  Positive for decreased concentration and sleep disturbance. The patient is nervous/anxious.    Last CBC Lab Results  Component Value Date   WBC 6.7 10/01/2021   HGB 14.9 10/01/2021   HCT 44.1 10/01/2021   MCV 93 10/01/2021   MCH 31.5 10/01/2021   RDW 12.2 10/01/2021   PLT 371 73/53/2992   Last metabolic panel Lab Results  Component Value Date   GLUCOSE 102 (H) 10/01/2021   NA 139 10/01/2021   K 4.4 10/01/2021   CL 100 10/01/2021   CO2 21 10/01/2021   BUN 17 10/01/2021   CREATININE 0.70 10/01/2021   EGFR 117 10/01/2021   CALCIUM 9.4 10/01/2021   PROT 6.8 10/01/2021   ALBUMIN 4.8 10/01/2021   LABGLOB 2.0 10/01/2021   AGRATIO 2.4 (H) 10/01/2021   BILITOT 0.4 10/01/2021   ALKPHOS 69 10/01/2021   AST 18 10/01/2021   ALT 17 10/01/2021   Last lipids Lab Results  Component Value Date   CHOL 136  05/18/2021   HDL 50 05/18/2021   LDLCALC 78 05/18/2021   TRIG 32 05/18/2021   CHOLHDL 2.7 05/18/2021   Last hemoglobin A1c Lab Results  Component Value Date   HGBA1C 5.8 (H) 10/01/2021   Last thyroid functions Lab Results  Component Value Date   TSH 1.020 10/01/2021   T3TOTAL 119 05/18/2021   Last vitamin D Lab Results  Component Value Date   VD25OH 25.5 (L) 10/01/2021       Objective     Today's Vitals   01/28/22 0813   BP: 118/73  Pulse: 80  Temp: 98.3 F (36.8 C)  SpO2: 100%  Weight: 157 lb 6.4 oz (71.4 kg)  Height: 5' 4.96" (1.65 m)   Body mass index is 26.22 kg/m.   BP Readings from Last 3 Encounters:  01/28/22 118/73  10/01/21 131/72  05/16/21 113/74    Wt Readings from Last 3 Encounters:  01/28/22 157 lb 6.4 oz (71.4 kg)  10/01/21 154 lb 14.4 oz (70.3 kg)  05/23/21 162 lb (73.5 kg)    Physical Exam Vitals and nursing note reviewed.  Constitutional:      Appearance: Normal appearance. She is well-developed.  HENT:     Head: Normocephalic and atraumatic.     Nose: Nose normal.     Mouth/Throat:     Mouth: Mucous membranes are moist.     Pharynx: Oropharynx is clear.  Eyes:     Extraocular Movements: Extraocular movements intact.     Conjunctiva/sclera: Conjunctivae normal.     Pupils: Pupils are equal, round, and reactive to light.  Neck:     Thyroid: Thyromegaly present.  Cardiovascular:     Rate and Rhythm: Normal rate and regular rhythm.     Pulses: Normal pulses.     Heart sounds: Normal heart sounds.  Pulmonary:     Effort: Pulmonary effort is normal.     Breath sounds: Normal breath sounds.  Abdominal:     General: Bowel sounds are normal. There is no distension.     Palpations: Abdomen is soft. There is no mass.     Tenderness: There is no abdominal tenderness. There is no right CVA tenderness, left CVA tenderness, guarding or rebound.     Hernia: No hernia is present.  Musculoskeletal:        General: Normal range of motion.     Cervical back: Normal range of motion and neck supple.  Lymphadenopathy:     Cervical: No cervical adenopathy.  Skin:    General: Skin is warm and dry.     Capillary Refill: Capillary refill takes less than 2 seconds.  Neurological:     General: No focal deficit present.     Mental Status: She is alert and oriented to person, place, and time.  Psychiatric:        Mood and Affect: Mood normal.        Behavior: Behavior normal.         Thought Content: Thought content normal.        Judgment: Judgment normal.     Assessment & Plan    1. Other fatigue Check labs, CBC, CMP, HgbA1c, TSH, and Free T4. Consider low dose levothyroxine to treat subclinical hypothyroid.   2. Prediabetes Check CMP and HgbA1c and treat high blood sugars as indicated   3. Thyromegaly Reviewed thyroid ultrasound showing normal thyroid without nodules   4. Vitamin D deficiency Patient has changed to OTC vitamin d 5000 iu daily.  Problem List Items Addressed This Visit       Endocrine   Thyromegaly     Other   Other fatigue - Primary   Relevant Orders   TSH   T4, free   CBC with Differential/Platelet   Hemoglobin A1c   Comp Met (CMET)   Prediabetes   Relevant Orders   TSH   T4, free   CBC with Differential/Platelet   Hemoglobin A1c   Comp Met (CMET)   Vitamin D deficiency     Return in about 3 months (around 04/30/2022) for check HgbA1c, ?hypothyroid .         Ronnell Freshwater, NP  [facility] at [facility] [phone] (phone) [phone] (fax)  [place]

## 2022-01-29 LAB — CBC WITH DIFFERENTIAL/PLATELET
Basophils Absolute: 0 10*3/uL (ref 0.0–0.2)
Basos: 0 %
EOS (ABSOLUTE): 0.1 10*3/uL (ref 0.0–0.4)
Eos: 2 %
Hematocrit: 43.2 % (ref 34.0–46.6)
Hemoglobin: 14.7 g/dL (ref 11.1–15.9)
Immature Grans (Abs): 0 10*3/uL (ref 0.0–0.1)
Immature Granulocytes: 0 %
Lymphocytes Absolute: 2.1 10*3/uL (ref 0.7–3.1)
Lymphs: 30 %
MCH: 31.3 pg (ref 26.6–33.0)
MCHC: 34 g/dL (ref 31.5–35.7)
MCV: 92 fL (ref 79–97)
Monocytes Absolute: 0.5 10*3/uL (ref 0.1–0.9)
Monocytes: 7 %
Neutrophils Absolute: 4.1 10*3/uL (ref 1.4–7.0)
Neutrophils: 61 %
Platelets: 308 10*3/uL (ref 150–450)
RBC: 4.69 x10E6/uL (ref 3.77–5.28)
RDW: 13.2 % (ref 11.7–15.4)
WBC: 6.8 10*3/uL (ref 3.4–10.8)

## 2022-01-29 LAB — COMPREHENSIVE METABOLIC PANEL
ALT: 15 IU/L (ref 0–32)
AST: 16 IU/L (ref 0–40)
Albumin/Globulin Ratio: 2.2 (ref 1.2–2.2)
Albumin: 4.7 g/dL (ref 3.8–4.8)
Alkaline Phosphatase: 75 IU/L (ref 44–121)
BUN/Creatinine Ratio: 22 (ref 9–23)
BUN: 13 mg/dL (ref 6–20)
Bilirubin Total: 0.5 mg/dL (ref 0.0–1.2)
CO2: 22 mmol/L (ref 20–29)
Calcium: 9.4 mg/dL (ref 8.7–10.2)
Chloride: 100 mmol/L (ref 96–106)
Creatinine, Ser: 0.59 mg/dL (ref 0.57–1.00)
Globulin, Total: 2.1 g/dL (ref 1.5–4.5)
Glucose: 97 mg/dL (ref 70–99)
Potassium: 4.4 mmol/L (ref 3.5–5.2)
Sodium: 138 mmol/L (ref 134–144)
Total Protein: 6.8 g/dL (ref 6.0–8.5)
eGFR: 121 mL/min/{1.73_m2} (ref >59)

## 2022-01-29 LAB — TSH: TSH: 1.17 u[IU]/mL (ref 0.450–4.500)

## 2022-01-29 LAB — HEMOGLOBIN A1C
Est. average glucose Bld gHb Est-mCnc: 114 mg/dL
Hgb A1c MFr Bld: 5.6 % (ref 4.8–5.6)

## 2022-01-29 LAB — T4, FREE: Free T4: 1.44 ng/dL (ref 0.82–1.77)

## 2022-05-16 ENCOUNTER — Encounter: Payer: Medicaid Other | Admitting: Nurse Practitioner

## 2022-08-07 DIAGNOSIS — Z124 Encounter for screening for malignant neoplasm of cervix: Secondary | ICD-10-CM | POA: Diagnosis not present

## 2022-08-07 DIAGNOSIS — L680 Hirsutism: Secondary | ICD-10-CM | POA: Diagnosis not present

## 2022-08-07 DIAGNOSIS — Z01419 Encounter for gynecological examination (general) (routine) without abnormal findings: Secondary | ICD-10-CM | POA: Diagnosis not present

## 2022-08-07 DIAGNOSIS — Z1151 Encounter for screening for human papillomavirus (HPV): Secondary | ICD-10-CM | POA: Diagnosis not present

## 2022-08-07 DIAGNOSIS — Z6828 Body mass index (BMI) 28.0-28.9, adult: Secondary | ICD-10-CM | POA: Diagnosis not present

## 2022-08-07 DIAGNOSIS — N926 Irregular menstruation, unspecified: Secondary | ICD-10-CM | POA: Diagnosis not present

## 2022-08-07 DIAGNOSIS — N760 Acute vaginitis: Secondary | ICD-10-CM | POA: Diagnosis not present

## 2022-09-26 DIAGNOSIS — N912 Amenorrhea, unspecified: Secondary | ICD-10-CM | POA: Diagnosis not present

## 2022-09-26 DIAGNOSIS — R300 Dysuria: Secondary | ICD-10-CM | POA: Diagnosis not present

## 2022-09-26 DIAGNOSIS — N760 Acute vaginitis: Secondary | ICD-10-CM | POA: Diagnosis not present

## 2022-09-30 ENCOUNTER — Ambulatory Visit
Admission: EM | Admit: 2022-09-30 | Discharge: 2022-09-30 | Disposition: A | Discharge status: Home / Self Care | Payer: BC Managed Care – PPO

## 2022-09-30 ENCOUNTER — Encounter: Payer: Self-pay | Admitting: Emergency Medicine

## 2022-09-30 DIAGNOSIS — H109 Unspecified conjunctivitis: Secondary | ICD-10-CM

## 2022-09-30 DIAGNOSIS — R053 Chronic cough: Secondary | ICD-10-CM

## 2022-09-30 DIAGNOSIS — J069 Acute upper respiratory infection, unspecified: Secondary | ICD-10-CM | POA: Diagnosis not present

## 2022-09-30 MED ORDER — PREDNISONE 20 MG PO TABS: 40.0000 mg | ORAL_TABLET | Freq: Every day | ORAL | 0 refills | Status: AC

## 2022-09-30 MED ORDER — POLYMYXIN B-TRIMETHOPRIM 10000-0.1 UNIT/ML-% OP SOLN: 1.0000 [drp] | Freq: Four times a day (QID) | OPHTHALMIC | 0 refills | Status: AC

## 2022-09-30 NOTE — ED Triage Notes (Signed)
Patient states that when she woke up on Saturday, her eyes were red.  Daughter had pink eye x 2 weeks ago.  Right eye is somewhat red.  Patient does work at a daycare, requesting FLU testing due to ears and chest hurting.

## 2022-09-30 NOTE — ED Provider Notes (Addendum)
[facility]    CSN: [id number] Arrival date & time: 09/30/22  1002      History   Chief Complaint Chief Complaint  Patient presents with   Conjunctivitis    HPI Mikayla Salazar is a 37 y.o. female.   Patient presents with bilateral eye redness and irritation, nasal congestion, cough, sneezing.  Patient reports eye symptoms started about 3 to 4 days ago.  Her daughter recently was being treated for pinkeye.  Patient used previously prescribed antibiotic eyedrops that were expired with minimal improvement.  Eye symptoms are more prevalent in the right eye but patient reports that she had some left eye irritation yesterday as well.  She reports purulent drainage and crustiness from the eyes.  Denies any blurry vision.  Patient does not wear contacts or glasses.  Patient also has nasal congestion, sneezing, nonproductive cough that has been present for about 2 weeks.  Denies any associated fever.  Denies history of asthma.  Patient does not smoke cigarettes.  Patient has taken over-the-counter medications with minimal improvement in symptoms.  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  Patient denies any shortness of breath but does report that she has chest discomfort that occurs only when she coughs.  Denies any current chest pain.  Denies sore throat, ear pain, nausea, vomiting, diarrhea, abdominal pain.  States that she takes birth control and a multivitamin daily.  Is currently taking metronidazole for bacterial vaginosis.   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   Impaired fasting glucose 06/03/2021   Encounter for general adult medical examination with abnormal findings 05/27/2021   Body mass index 26.0-26.9, adult 05/27/2021   Encounter to establish care 05/14/2021   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   Peripheral polyneuropathy 05/14/2021   History of partial colectomy 05/14/2021   Migraine 05/03/2021   Low grade squamous intraepithelial lesion (LGSIL) at risk for high grade squamous intraepithelial lesion (HGSIL) on cytologic smear of cervix 05/03/2021   Burkitt lymphoma ([place]) 05/03/2021   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 Authorizing Provider  Drospirenone-Estetrol (NEXTSTELLIS) 3-14.2 MG TABS Take by mouth.   Yes [provider]  metroNIDAZOLE (FLAGYL) 500 MG tablet Take 500 mg by  mouth 2 (two) times daily.   Yes [provider]  predniSONE (DELTASONE) 20 MG tablet Take 2 tablets (40 mg total) by mouth daily for 5 days. 09/30/22 10/05/22 Yes Michelangelo Rindfleisch, Michele Rockers, FNP  trimethoprim-polymyxin b (POLYTRIM) ophthalmic solution Place 1 drop into both eyes every 6 (six) hours for 7 days. 09/30/22 10/07/22 Yes Sunny Gains, Michele Rockers, FNP  ergocalciferol (DRISDOL) 1.25 MG (50000 UT) capsule Take 1 capsule (50,000 Units total) by mouth once a week. Patient not taking: Reported on 01/28/2022 10/03/21   Ronnell Freshwater, NP  norethindrone (MICRONOR) 0.35 MG tablet Take 1 tablet by mouth daily. 04/13/21   [provider]  omeprazole (PRILOSEC) 20 MG capsule Take 1 capsule (20 mg total) by mouth daily. Patient not taking: Reported on 01/28/2022 10/01/21   Ronnell Freshwater, NP  Secnidazole (SOLOSEC) 2 g PACK Solosec 2 gram oral DR granules in packet Patient not taking: Reported on 01/28/2022    [provider]    Family History History reviewed. No pertinent family history.  Social History Social History   Tobacco Use   Smoking status: Never   Smokeless tobacco: Never  Substance Use Topics   Alcohol use: No   Drug use: No     Allergies   Patient has no known allergies.   Review of Systems Review of Systems Per HPI  Physical Exam Triage Vital Signs ED Triage Vitals  Enc Vitals Group     BP 09/30/22 1101 111/68     Pulse Rate 09/30/22 1101 86     Resp 09/30/22 1101 18     Temp 09/30/22 1101 98.4 F (36.9 C)     Temp Source 09/30/22 1101 Oral     SpO2 09/30/22 1101 97 %     Weight 09/30/22 1102 160 lb (72.6 kg)     Height 09/30/22 1102 '5\' 3"'$  (1.6 m)     Head Circumference --      Peak Flow --      Pain Score 09/30/22 1102 3     Pain Loc --      Pain Edu? --      Excl. in [place]? --    No data found.  Updated Vital Signs BP 111/68 (BP Location: Right Arm)   Pulse 86   Temp 98.4 F (36.9 C) (Oral)   Resp 18   Ht '5\' 3"'$  (1.6 m)   Wt 160 lb (72.6 kg)   LMP 08/07/2022   SpO2 97%   Breastfeeding No   BMI 28.34 kg/m   Visual Acuity Right Eye Distance:   Left Eye Distance:   Bilateral Distance:    Right Eye Near:   Left Eye Near:    Bilateral Near:     Physical Exam Constitutional:      General: She is not in acute distress.    Appearance: Normal appearance. She is not toxic-appearing or diaphoretic.  HENT:     Head: Normocephalic and atraumatic.     Right Ear: Tympanic membrane and ear canal normal.     Left Ear: Tympanic membrane and ear canal normal.     Nose: Congestion present.     Mouth/Throat:     Mouth: Mucous membranes are moist.      Pharynx: No posterior oropharyngeal erythema.  Eyes:     General: Lids are normal. Lids are everted, no foreign bodies appreciated. Vision grossly intact. Gaze aligned appropriately.     Extraocular Movements: Extraocular movements intact.     Conjunctiva/sclera:  Right eye: Right conjunctiva is injected. No chemosis, exudate or hemorrhage.    Left eye: Left conjunctiva is injected. No chemosis, exudate or hemorrhage.    Pupils: Pupils are equal, round, and reactive to light.  Cardiovascular:     Rate and Rhythm: Normal rate and regular rhythm.     Pulses: Normal pulses.     Heart sounds: Normal heart sounds.  Pulmonary:     Effort: Pulmonary effort is normal. No respiratory distress.     Breath sounds: Normal breath sounds. No stridor. No wheezing, rhonchi or rales.  Abdominal:     General: Abdomen is flat. Bowel sounds are normal.     Palpations: Abdomen is soft.  Musculoskeletal:        General: Normal range of motion.     Cervical back: Normal range of motion.  Skin:    General: Skin is warm and dry.  Neurological:     General: No focal deficit present.     Mental Status: She is alert and oriented to person, place, and time. Mental status is at baseline.  Psychiatric:        Mood and Affect: Mood normal.        Behavior: Behavior normal.      UC Treatments / Results  Labs (all labs ordered are listed, but only abnormal results are displayed) Labs Reviewed - No data to display  EKG   Radiology No results found.  Procedures Procedures (including critical care time)  Medications Ordered in UC Medications - No data to display  Initial Impression / Assessment and Plan / UC Course  I have reviewed the triage vital signs and the nursing notes.  Pertinent labs & imaging results that were available during my care of the patient were reviewed by me and considered in my medical decision making (see chart for details).     Patient's symptoms appear to be more  consistent with viral conjunctivitis but given that her daughter is being treated for bacterial conjunctivitis and patient is reporting crustiness from the eyes, will opt to treat with antibiotic eyedrops.  Visual acuity appears normal.  Patient reporting 2-week history of upper respiratory symptoms and cough.  Most likely viral in nature.  There are no adventitious lung sounds on exam so do not think that chest imaging is necessary.  Suspect possible acute viral bronchitis.  Will treat with prednisone steroid burst.  Currently taking metronidazole for bacterial vaginosis but this should be safe with this.  Patient requesting flu testing but discussed with patient given duration of symptoms, flu test may not be accurate and is not beneficial at this time.  Patient verbalized understanding and declined flu testing and COVID testing with shared decision making.  Do not think any additional antibiotic therapy is necessary at this time as symptoms appear viral in nature.  Patient reports history of lymphoma in the past but not currently so prednisone should be safe.  Patient was advised to follow-up if any symptoms persist or worsen.  Patient verbalized understanding and was agreeable with plan. Final Clinical Impressions(s) / UC Diagnoses   Final diagnoses:  Bacterial conjunctivitis of right eye  Acute upper respiratory infection  Persistent cough     Discharge Instructions      I have prescribed you antibiotic eyedrops as well as prednisone to help alleviate symptoms.  Please follow-up if any symptoms persist or worsen.    ED Prescriptions     Medication Sig Dispense Auth. Provider   trimethoprim-polymyxin b (POLYTRIM)  ophthalmic solution Place 1 drop into both eyes every 6 (six) hours for 7 days. 10 mL Oswaldo Conroy E, [place]   predniSONE (DELTASONE) 20 MG tablet Take 2 tablets (40 mg total) by mouth daily for 5 days. 10 tablet Teodora Medici, [place]      PDMP not reviewed this encounter.   Teodora Medici, [place] 09/30/22 [place], [place], [place] 09/30/22 1240

## 2022-09-30 NOTE — Discharge Instructions (Signed)
I have prescribed you antibiotic eyedrops as well as prednisone to help alleviate symptoms.  Please follow-up if any symptoms persist or worsen.

## 2023-02-12 DIAGNOSIS — R7303 Prediabetes: Secondary | ICD-10-CM | POA: Diagnosis not present

## 2023-02-20 ENCOUNTER — Ambulatory Visit
Admission: EM | Admit: 2023-02-20 | Discharge: 2023-02-20 | Disposition: A | Discharge status: Home / Self Care | Payer: BC Managed Care – PPO | Attending: Nurse Practitioner | Admitting: Nurse Practitioner

## 2023-02-20 DIAGNOSIS — R0981 Nasal congestion: Secondary | ICD-10-CM | POA: Diagnosis not present

## 2023-02-20 DIAGNOSIS — J301 Allergic rhinitis due to pollen: Secondary | ICD-10-CM

## 2023-02-20 MED ORDER — AZITHROMYCIN 250 MG PO TABS: 250.0000 mg | ORAL_TABLET | Freq: Every day | ORAL | 0 refills | Status: DC

## 2023-02-20 NOTE — ED Provider Notes (Signed)
[facility]    CSN: [id number] Arrival date & time: 02/20/23  0956      History   Chief Complaint Chief Complaint  Patient presents with   Nasal Congestion   Cough    HPI Mikayla Salazar is a 37 y.o. female.   HPI  She is in today for evaluation of nasal congestion along with chest congestion.  She reports teeth pain.  She reports that she has been having symptoms for about a week.  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  She reports that she has a new dog for which she has been walking.  She is aware that the pollen is high.  She denies a history of allergic rhinitis.  She has not tried any over-the-counter medication.  She is convinced that she has a sinus infection and that she needs a Z-Pak.  Denies any fever, chills, shortness of breath.  She denies history of sinus problems. 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   Impaired fasting glucose 06/03/2021   Encounter for general adult medical examination with abnormal findings 05/27/2021   Body mass index 26.0-26.9, adult 05/27/2021   Encounter to establish care 05/14/2021   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   Peripheral polyneuropathy 05/14/2021   History of partial colectomy 05/14/2021   Migraine 05/03/2021   Low grade squamous intraepithelial lesion (LGSIL) at risk for high grade squamous intraepithelial lesion (HGSIL) on cytologic smear of cervix 05/03/2021   Burkitt lymphoma ([facility]) 05/03/2021   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 Authorizing Provider  norethindrone (MICRONOR) 0.35 MG tablet Take 1 tablet by mouth daily. 04/13/21  Yes [provider]  Drospirenone-Estetrol (NEXTSTELLIS) 3-14.2 MG TABS Take by mouth.    [provider]  Secnidazole (SOLOSEC) 2 g PACK Solosec 2 gram oral DR granules in packet Patient not taking: Reported on 01/28/2022    [provider]    Family History History reviewed. No pertinent family history.  Social History Social History   Tobacco Use   Smoking status: Never   Smokeless tobacco: Never  Substance Use Topics   Alcohol use: No   Drug use: No     Allergies   Patient has no known allergies.   Review of Systems Review of Systems   Physical Exam Triage Vital Signs ED  Triage Vitals 02/20/23 1011  Enc Vitals Group     BP 120/79     Pulse Rate 79     Resp 18     Temp 98.3 F (36.8 C)     Temp Source Oral     SpO2 98 %     Weight      Height      Head Circumference      Peak Flow      Pain Score      Pain Loc      Pain Edu?      Excl. in [place]?    No data found.  Updated Vital Signs BP 120/79 (BP Location: Left Arm)   Pulse 79   Temp 98.3 F (36.8 C) (Oral)   Resp 18   LMP 02/20/2023   SpO2 98%   Visual Acuity Right Eye Distance:   Left Eye Distance:   Bilateral Distance:    Right Eye Near:   Left Eye Near:    Bilateral Near:     Physical Exam Constitutional:      General: She is not in acute distress.    Appearance: She is not ill-appearing, toxic-appearing or diaphoretic.  HENT:     Head: Normocephalic.     Right Ear: There is impacted  cerumen (soft).     Left Ear: Tympanic membrane normal. There is no impacted cerumen (Soft).     Nose: Nose normal.     Mouth/Throat:     Mouth: Mucous membranes are dry.  Eyes:     Extraocular Movements: Extraocular movements intact.     Pupils: Pupils are equal, round, and reactive to light.  Cardiovascular:     Rate and Rhythm: Normal rate and regular rhythm.     Pulses: Normal pulses.     Heart sounds: Normal heart sounds.  Pulmonary:     Effort: Pulmonary effort is normal.  Musculoskeletal:        General: Normal range of motion.     Cervical back: Normal range of motion.  Skin:    General: Skin is warm and dry.     Capillary Refill: Capillary refill takes less than 2 seconds.  Neurological:     General: No focal deficit present.     Mental Status: She is alert and oriented to person, place, and time.  Psychiatric:        Mood and Affect: Mood normal.      UC Treatments / Results  Labs (all labs ordered are listed, but only abnormal results are displayed) Labs Reviewed - No data to display  EKG   Radiology No results found.  Procedures Procedures (including critical care time)  Medications Ordered in UC Medications - No data to display  Initial Impression / Assessment and Plan / UC Course  I have reviewed the triage vital signs and the nursing notes.  Pertinent labs & imaging results that were available during my care of the patient were reviewed by me and considered in my medical decision making (see chart for details).     Nasal congestion Final Clinical Impressions(s) / UC Diagnoses   Final diagnoses:  Nasal congestion  Seasonal allergic rhinitis due to pollen     Discharge Instructions      You have been diagnosed with allergic rhinitis related to pollen.  The recommendation is for over-the-counter antihistamine, Claritin, Zyrtec, Xyzal along with fluticasone as directed.  You are encouraged to start Mucinex D to assist with breaking up the  mucus.   Due to your current work environment you have requested antibiotic treatment.  You have been prescribed azithromycin 250 mg to take as directed      ED Prescriptions   None    PDMP not reviewed this encounter.   Thad Ranger [place], [place] 02/20/23 1036

## 2023-02-20 NOTE — ED Triage Notes (Signed)
Complains of sinus headache,nasal congestion and cough x 1 week. Pt reports cough is waking her up at night.

## 2023-02-20 NOTE — Discharge Instructions (Addendum)
You have been diagnosed with allergic rhinitis related to pollen.  The recommendation is for over-the-counter antihistamine, Claritin, Zyrtec, Xyzal along with fluticasone as directed.  You are encouraged to start Mucinex D to assist with breaking up the mucus.   Due to your current work environment you have requested antibiotic treatment.  You have been prescribed azithromycin 250 mg to take as directed

## 2023-04-26 ENCOUNTER — Ambulatory Visit
Admission: EM | Admit: 2023-04-26 | Discharge: 2023-04-26 | Disposition: A | Discharge status: Home / Self Care | Payer: BC Managed Care – PPO

## 2023-04-26 DIAGNOSIS — H109 Unspecified conjunctivitis: Secondary | ICD-10-CM

## 2023-04-26 MED ORDER — POLYMYXIN B-TRIMETHOPRIM 10000-0.1 UNIT/ML-% OP SOLN: 1.0000 [drp] | OPHTHALMIC | 0 refills | Status: DC

## 2023-04-26 NOTE — Discharge Instructions (Signed)
You have bacterial conjunctivitis (pink eye) which is an eye infection.    - Use antibiotic eye medication sent to pharmacy as directed.  - Change your pillowcase after 2 to 3 days to avoid reinfection.  - You may take Tylenol every 6 hours as needed for any pain you may have.  - Avoid scratching your eye.  Wash your hands frequently to avoid spread of infection to others.  Perform warm compresses to your eye before applying the eye medication.  If you wear contacts, do not use contacts for 14 days. Instead, use your eye glasses for vision correction. Follow-up with eye doctor as needed for new or worsening symptoms.   If you develop any new or worsening symptoms or do not improve in the next 2 to 3 days, please return.  If your symptoms are severe, please go to the emergency room.  Follow-up with your primary care provider for further evaluation and management of your symptoms as well as ongoing wellness visits.  I hope you feel better! 

## 2023-04-26 NOTE — ED Triage Notes (Signed)
"  Thursday my eyes started feeling funny, drainage started with redness same day in left eye". This continues with "now right eye red, watery". "I did use eye drops had previously, name unknown". Pain/burning started "after using those drops".

## 2023-04-26 NOTE — ED Provider Notes (Signed)
[facility]    CSN: [id number] Arrival date & time: 04/26/23  1156      History   Chief Complaint Chief Complaint  Patient presents with   Eye Problem    HPI Mikayla Salazar is a 37 y.o. female.   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 Symptoms started with redness, swelling to the left eye as well as crusty drainage and slight tenderness to the left eye. Symptoms have now moved to the right eye. She does not wear contacts for vision correction but does wear glasses. No recent trauma/injuries to the eyes. No vision changes, viral URI symptoms, fevers, eye pain, or headaches. She wonders if this could be due to going to sleep without washing off her makeup first. No recent sick contacts with similar symptoms. States this feels similar to the last time she had pink eye and had some leftover antibiotic eye drops. She has used antibiotic eye drops for the last 24ish hours and states they've helped to clear up some of the drainage and redness to the left eye.    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[facility])     Patient Active Problem List   Diagnosis Date Noted   Prediabetes 01/28/2022   Vitamin D deficiency 01/28/2022   Chest pain 10/01/2021   Hematuria 10/01/2021   Gastroesophageal reflux disease without esophagitis 10/01/2021   Other fatigue 10/01/2021   Alopecia 10/01/2021   Thyromegaly 10/01/2021   Impaired fasting glucose 06/03/2021   Encounter for general adult medical examination with abnormal findings 05/27/2021   Body mass index 26.0-26.9, adult 05/27/2021   Encounter to establish care 05/14/2021   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   Peripheral polyneuropathy 05/14/2021   History of partial colectomy 05/14/2021   Migraine 05/03/2021   Low grade squamous intraepithelial lesion (LGSIL) at risk for high grade  squamous intraepithelial lesion (HGSIL) on cytologic smear of cervix 05/03/2021   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 Authorizing Provider  Drospirenone-Estetrol (NEXTSTELLIS) 3-14.2 MG TABS Take by mouth.   Yes [provider]  trimethoprim-polymyxin b (POLYTRIM) ophthalmic solution Place 1 drop into the left eye every 4 (four) hours. 04/26/23  Yes Katiana Ruland, Donavan Burnet, FNP  UNKNOWN TO PATIENT "Old" rx eye drop. In date though.   Yes [provider]  azithromycin (ZITHROMAX Z-PAK) 250 MG tablet Take 1 tablet (250 mg total) by mouth daily. 2 tablets day one and daily for 4 days 02/20/23  Thad Ranger M, NP  norethindrone (MICRONOR) 0.35 MG tablet Take 1 tablet by mouth daily. 04/13/21   [provider]  Secnidazole (SOLOSEC) 2 g PACK Solosec 2 gram oral DR granules in packet Patient not taking: Reported on 01/28/2022    [provider]    Family History History reviewed. No pertinent family history.  Social History Social History   Tobacco Use   Smoking status: Never   Smokeless tobacco: Never  Vaping Use   Vaping status: Never Used  Substance Use Topics   Alcohol use: Never   Drug use: Never     Allergies   Patient has no known allergies.   Review of Systems Review of Systems Per HPI  Physical Exam Triage Vital Signs ED Triage Vitals  Encounter Vitals Group     BP 04/26/23 1237 124/87      Systolic BP Percentile --      Diastolic BP Percentile --      Pulse Rate 04/26/23 1237 73     Resp 04/26/23 1237 18     Temp 04/26/23 1237 97.9 F (36.6 C)     Temp Source 04/26/23 1237 Oral     SpO2 04/26/23 1237 97 %     Weight 04/26/23 1235 151 lb (68.5 kg)     Height 04/26/23 1235 5\' 4"  (1.626 m)     Head Circumference --      Peak Flow --      Pain Score 04/26/23 1230 0     Pain Loc --      Pain Education --      Exclude from Growth Chart --    No data found.  Updated Vital Signs BP 124/87 (BP Location: Left Arm)   Pulse 73   Temp 97.9 F (36.6 C) (Oral)   Resp 18   Ht 5\' 4"  (1.626 m)   Wt 151 lb (68.5 kg)   LMP  (LMP Unknown)   SpO2 97%   BMI 25.92 kg/m   Visual Acuity Right Eye Distance: 20/50 (Uncorrected, Normally wears glasses.) Left Eye Distance: 20/50 (Uncorrected, Normally wears glasses.) Bilateral Distance: 20/40 (Uncorrected, Normally wears glasses.)  Right Eye Near:   Left Eye Near:    Bilateral Near:     Physical Exam Vitals and nursing note reviewed.  Constitutional:      Appearance: She is not ill-appearing or toxic-appearing.  HENT:     Head: Normocephalic and atraumatic.     Right Ear: Hearing and external ear normal.     Left Ear: Hearing and external ear normal.     Nose: Nose normal.     Mouth/Throat:     Lips: Pink.     Mouth: Mucous membranes are moist. No injury.     Tongue: No lesions. Tongue does not deviate from midline.     Palate: No mass and lesions.     Pharynx: Oropharynx is clear. Uvula midline. No pharyngeal swelling, oropharyngeal exudate, posterior oropharyngeal erythema or uvula swelling.     Tonsils: No tonsillar exudate or tonsillar abscesses.  Eyes:     General: Lids are normal. Vision grossly intact. Gaze aligned appropriately.        Left eye: Discharge (crusty golden/purulent left eye discharge) present.    Extraocular Movements: Extraocular movements intact.     Conjunctiva/sclera: Conjunctivae normal.      Pupils: Pupils are equal, round, and reactive to light.     Comments: EOMs intact without pain or dizziness elicited. Left eye is minimally  injected.   Pulmonary:     Effort: Pulmonary effort is normal.  Musculoskeletal:     Cervical back: Neck supple.  Skin:    General: Skin is warm and dry.     Capillary Refill: Capillary refill takes less than 2 seconds.     Findings: No rash.  Neurological:     General: No focal deficit present.     Mental Status: She is alert and oriented to person, place, and time. Mental status is at baseline.     Cranial Nerves: No dysarthria or facial asymmetry.  Psychiatric:        Mood and Affect: Mood normal.        Speech: Speech normal.        Behavior: Behavior normal.        Thought Content: Thought content normal.        Judgment: Judgment normal.      UC Treatments / Results  Labs (all labs ordered are listed, but only abnormal results are displayed) Labs Reviewed - No data to display  EKG   Radiology No results found.  Procedures Procedures (including critical care time)  Medications Ordered in UC Medications - No data to display  Initial Impression / Assessment and Plan / UC Course  I have reviewed the triage vital signs and the nursing notes.  Pertinent labs & imaging results that were available during my care of the patient were reviewed by me and considered in my medical decision making (see chart for details).  Bacterial conjunctivitis of left eye Presentation consistent with acute bacterial conjunctivitis.  HEENT exam stable.  Ophthalmic medication as prescribed for the next 7 days.  Warm compress recommended frequently.  Over the counter medications as needed for aches/pains. Hand hygiene discussed to prevent spread of infection to others.  Advised to change pillowcase after 2 to 3 days of antibiotics to avoid reinfection.   Counseled patient on potential for adverse effects with medications prescribed/recommended  today, strict ER and return-to-clinic precautions discussed, patient verbalized understanding.    Final Clinical Impressions(s) / UC Diagnoses   Final diagnoses:  Bacterial conjunctivitis of left eye     Discharge Instructions      You have bacterial conjunctivitis (pink eye) which is an eye infection.    - Use antibiotic eye medication sent to pharmacy as directed.  - Change your pillowcase after 2 to 3 days to avoid reinfection.  - You may take Tylenol every 6 hours as needed for any pain you may have.  - Avoid scratching your eye.  Wash your hands frequently to avoid spread of infection to others.  Perform warm compresses to your eye before applying the eye medication.  If you wear contacts, do not use contacts for 14 days. Instead, use your eye glasses for vision correction. Follow-up with eye doctor as needed for new or worsening symptoms.   If you develop any new or worsening symptoms or do not improve in the next 2 to 3 days, please return.  If your symptoms are severe, please go to the emergency room.  Follow-up with your primary care provider for further evaluation and management of your symptoms as well as ongoing wellness visits.  I hope you feel better!     ED Prescriptions     Medication Sig Dispense Auth. Provider   trimethoprim-polymyxin b (POLYTRIM) ophthalmic solution Place 1 drop into the left eye every 4 (four) hours. 10 mL Carlisle Beers, FNP      PDMP  not reviewed this encounter.   Carlisle Beers, [place] 04/26/23 2045

## 2023-08-14 ENCOUNTER — Encounter: Payer: Self-pay | Admitting: Family Medicine

## 2023-08-14 ENCOUNTER — Ambulatory Visit (INDEPENDENT_AMBULATORY_CARE_PROVIDER_SITE_OTHER): Payer: BC Managed Care – PPO | Admitting: Family Medicine

## 2023-08-14 VITALS — BP 120/81 | HR 73 | Ht 64.0 in | Wt 148.4 lb

## 2023-08-14 DIAGNOSIS — H6121 Impacted cerumen, right ear: Secondary | ICD-10-CM | POA: Insufficient documentation

## 2023-08-14 DIAGNOSIS — J069 Acute upper respiratory infection, unspecified: Secondary | ICD-10-CM | POA: Insufficient documentation

## 2023-08-14 NOTE — Assessment & Plan Note (Signed)
Cerumen removed with curette.  Was still unable to visualize right TM.  Recommended patient use over-the-counter Debrox.

## 2023-08-14 NOTE — Patient Instructions (Signed)
It was nice to see you today,  We addressed the following topics today: -You have a viral respiratory infection.  Over-the-counter treatments are recommended as needed.  For cough you can use any over-the-counter treatment containing guaifenesin or dextromethorphan such as Mucinex DM. - You can use throat lozenges such as Cepacol for sore throat - You can use nasal saline and Afrin nasal spray for nasal congestion. - For your ear pain, use nasal corticosteroids like fluticasone or Flonase daily until the pain resolves.  You can also use Afrin which may work faster, but you should use Afrin for 5 days or less. - You can use over-the-counter Debrox to help with the earwax in your right ear.  Have a great day,  Frederic Jericho, MD

## 2023-08-14 NOTE — Progress Notes (Signed)
   Acute Office Visit  Subjective:     Patient ID: Mikayla Salazar, female    DOB: 11-Feb-1986, 37 y.o.   MRN: [id number]  Chief Complaint  Patient presents with   Cough   Ear Pain    HPI  Patient has had over 1 week of cough and right ear pain.  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  No history of asthma or seasonal allergies.  Has not taken any over-the-counter medications for her symptoms.  Sore throat only if she coughs.  No rhinorrhea or nasal congestion.  Ear does not feel muffled or no hearing loss.  Patient went to the dentist today and had dental procedure involving localized anesthetic.  ROS      Objective:    BP 120/81   Pulse 73   Ht 5\' 4"  (1.626 m)   Wt 148 lb 6.4 oz (67.3 kg)   SpO2 100%   BMI 25.47 kg/m    General: Alert, oriented HEENT: Posterior right oral mucosa above the bottom molars shows previous local anesthetic injection earlier today.  Left tympanic membrane normal.  Right TM not visualized is not after extensive cerumen removal with curette. CV: Regular rate rhythm Pulmonary: Lungs are bilaterally   No results found for any visits on 08/14/23.      Assessment & Plan:   Viral upper respiratory tract infection Assessment & Plan: Over 1 week of symptoms.  Recommended over-the-counter symptomatic treatments.  Recommended Flonase for eustachian tube dysfunction associated with URI.   Impacted cerumen of right ear Assessment & Plan: Cerumen removed with curette.  Was still unable to visualize right TM.  Recommended patient use over-the-counter Debrox.      Return for physical.  Sandre Kitty, MD

## 2023-08-14 NOTE — Assessment & Plan Note (Signed)
Over 1 week of symptoms.  Recommended over-the-counter symptomatic treatments.  Recommended Flonase for eustachian tube dysfunction associated with URI.

## 2023-08-20 DIAGNOSIS — Z6826 Body mass index (BMI) 26.0-26.9, adult: Secondary | ICD-10-CM | POA: Diagnosis not present

## 2023-08-20 DIAGNOSIS — Z01419 Encounter for gynecological examination (general) (routine) without abnormal findings: Secondary | ICD-10-CM | POA: Diagnosis not present

## 2023-08-26 ENCOUNTER — Other Ambulatory Visit: Payer: Self-pay

## 2023-08-26 DIAGNOSIS — R7303 Prediabetes: Secondary | ICD-10-CM

## 2023-08-26 DIAGNOSIS — R5383 Other fatigue: Secondary | ICD-10-CM

## 2023-08-26 DIAGNOSIS — Z0000 Encounter for general adult medical examination without abnormal findings: Secondary | ICD-10-CM

## 2023-09-11 ENCOUNTER — Other Ambulatory Visit: Payer: BC Managed Care – PPO

## 2023-09-17 ENCOUNTER — Encounter: Payer: BC Managed Care – PPO | Admitting: Family Medicine

## 2023-10-15 ENCOUNTER — Ambulatory Visit
Admission: EM | Admit: 2023-10-15 | Discharge: 2023-10-15 | Disposition: A | Discharge status: Home / Self Care | Payer: Medicaid Other

## 2023-10-15 ENCOUNTER — Encounter: Payer: Self-pay | Admitting: *Deleted

## 2023-10-15 ENCOUNTER — Other Ambulatory Visit: Payer: Self-pay

## 2023-10-15 DIAGNOSIS — J101 Influenza due to other identified influenza virus with other respiratory manifestations: Secondary | ICD-10-CM

## 2023-10-15 DIAGNOSIS — R87612 Low grade squamous intraepithelial lesion on cytologic smear of cervix (LGSIL): Secondary | ICD-10-CM | POA: Insufficient documentation

## 2023-10-15 LAB — POCT INFLUENZA A/B
Influenza A, POC: POSITIVE — AB
Influenza B, POC: NEGATIVE

## 2023-10-15 MED ORDER — OSELTAMIVIR PHOSPHATE 75 MG PO CAPS: 75.0000 mg | ORAL_CAPSULE | Freq: Two times a day (BID) | ORAL | 0 refills | Status: DC

## 2023-10-15 MED ORDER — ONDANSETRON 4 MG PO TBDP: 4.0000 mg | ORAL_TABLET | Freq: Three times a day (TID) | ORAL | 0 refills | Status: DC | PRN

## 2023-10-15 NOTE — ED Triage Notes (Signed)
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 Reports exposure to norovirus at work. Her child also tested positive for fluA. Reports sx since Sunday. She needs a note for work and would like something for nausea

## 2023-10-15 NOTE — ED Provider Notes (Signed)
 [facility]    CSN: [id number] Arrival date & time: 10/15/23  1013      History   Chief Complaint Chief Complaint  Patient presents with   Diarrhea    HPI Mikayla Salazar is a 38 y.o. female.   Patient here today for evaluation of bodyaches headache and chills that started 3 to 4 days ago.  She reports that her child recently tested positive for flu.  She reports that she has had nausea and would like medication for same if possible.  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  The history is provided by the patient.  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[facility])     Patient Active Problem List   Diagnosis Date Noted   Low grade squamous intraepith lesion on cytologic smear cervix (lgsil) 10/15/2023   Viral upper respiratory tract infection 08/14/2023   Impacted cerumen of right ear 08/14/2023   Prediabetes 01/28/2022   Vitamin D  deficiency 01/28/2022   Chest pain 10/01/2021   Hematuria 10/01/2021   Gastroesophageal reflux disease without esophagitis 10/01/2021   Other fatigue 10/01/2021   Alopecia 10/01/2021   Thyromegaly 10/01/2021   Impaired fasting glucose 06/03/2021   Encounter for general adult medical examination with abnormal findings 05/27/2021   Body mass index 26.0-26.9, adult 05/27/2021   Encounter to establish care 05/14/2021   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   Peripheral polyneuropathy 05/14/2021   History of partial colectomy 05/14/2021   Migraine 05/03/2021   Low grade squamous intraepithelial lesion (LGSIL) at risk for high grade squamous intraepithelial lesion (HGSIL) on cytologic smear of cervix 05/03/2021   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 Authorizing Provider  drospirenone-ethinyl estradiol (YAZ) 3-0.02 MG tablet Take 1 tablet by mouth daily. 03/25/23  Yes [provider]  drospirenone-ethinyl estradiol (YAZ) 3-0.02 MG tablet Take 1 tablet by mouth daily. 08/20/23  Yes [provider]  drospirenone-ethinyl estradiol (YAZ) 3-0.02 MG tablet Take 1 tablet by mouth daily. 06/30/23  Yes [provider]  ondansetron  (ZOFRAN -ODT) 4 MG disintegrating tablet Take 1 tablet (4 mg total) by mouth every 8 (eight) hours as needed. 10/15/23  Yes Billy Asberry FALCON, PA-C  oseltamivir  (TAMIFLU ) 75 MG capsule Take 1 capsule (75 mg total) by mouth every 12 (twelve) hours. 10/15/23  Yes Billy Asberry FALCON, PA-C  azithromycin  (ZITHROMAX  Z-PAK) 250 MG tablet Take 1 tablet (250 mg total) by mouth daily. 2 tablets day one and daily for  4 days 02/20/23   Myrna Camelia HERO, NP  Drospirenone-Estetrol (NEXTSTELLIS) 3-14.2 MG TABS Take by mouth.    [provider]  Drospirenone-Estetrol (NEXTSTELLIS) 3-14.2 MG TABS     [provider]  metroNIDAZOLE  (METROGEL ) 0.75 % vaginal gel Place 1 Applicatorful vaginally at bedtime.    [provider]  norethindrone (MICRONOR) 0.35 MG tablet Take 1 tablet by mouth daily. 04/13/21   [provider]  predniSONE  (DELTASONE ) 20 MG tablet Take 20 mg by mouth daily with breakfast.    [provider]  Secnidazole (SOLOSEC) 2 g PACK Solosec 2 gram oral DR granules in packet Patient not taking: Reported on 01/28/2022    [provider]  tinidazole  (TINDAMAX) 500 MG tablet Take 500 mg by mouth in the morning and at bedtime.    [provider]  trimethoprim -polymyxin b  (POLYTRIM ) ophthalmic solution Place 1 drop into the left eye every 4 (four) hours. 04/26/23   Enedelia Dorna HERO, FNP  trimethoprim -polymyxin b  (POLYTRIM ) ophthalmic solution Place 1 drop into both eyes every 6 (six) hours.    [provider]  UNKNOWN TO PATIENT Old rx eye drop. In date though.    [provider]    Family History History reviewed. No pertinent family history.  Social History Social History   Tobacco Use   Smoking status: Never   Smokeless tobacco: Never  Vaping Use   Vaping status: Never Used  Substance Use Topics   Alcohol use: Never   Drug use: Never     Allergies   Patient has no known allergies.   Review of Systems Review of Systems  Constitutional:  Positive for chills. Negative for fever.  HENT:  Positive for congestion and sore throat. Negative for ear pain.   Eyes:  Negative for discharge and redness.  Respiratory:  Positive for cough. Negative for shortness of breath and wheezing.   Gastrointestinal:  Positive for diarrhea and nausea. Negative for abdominal pain and vomiting.  Musculoskeletal:  Positive for myalgias.     Physical Exam Triage Vital Signs ED Triage Vitals  Encounter Vitals Group     BP 10/15/23 1232 132/89     Systolic BP Percentile --      Diastolic BP Percentile --      Pulse Rate 10/15/23 1232 90     Resp 10/15/23 1232 18     Temp 10/15/23 1232 98.6 F (37 C)     Temp Source 10/15/23 1232 Oral     SpO2 10/15/23 1232 98 %     Weight --      Height --      Head Circumference --      Peak Flow --      Pain Score 10/15/23 1229 4     Pain Loc --      Pain Education --      Exclude from Growth Chart --    No data found.  Updated Vital Signs BP 132/89 (BP Location: Left Arm)   Pulse 90   Temp 98.6 F (37 C) (Oral)   Resp 18   LMP 09/29/2023 (Approximate)    SpO2 98%   Visual Acuity Right Eye Distance:   Left Eye Distance:   Bilateral Distance:    Right Eye Near:   Left Eye Near:    Bilateral Near:     Physical Exam Vitals and nursing note reviewed.  Constitutional:      General: She is not in acute distress.    Appearance:  Normal appearance. She is not ill-appearing.  HENT:     Head: Normocephalic and atraumatic.     Nose: Congestion present.     Mouth/Throat:     Mouth: Mucous membranes are moist.     Pharynx: No oropharyngeal exudate or posterior oropharyngeal erythema.  Eyes:     Conjunctiva/sclera: Conjunctivae normal.  Cardiovascular:     Rate and Rhythm: Normal rate and regular rhythm.     Heart sounds: Normal heart sounds. No murmur heard. Pulmonary:     Effort: Pulmonary effort is normal. No respiratory distress.     Breath sounds: Normal breath sounds. No wheezing, rhonchi or rales.  Skin:    General: Skin is warm and dry.  Neurological:     Mental Status: She is alert.  Psychiatric:        Mood and Affect: Mood normal.        Thought Content: Thought content normal.      UC Treatments / Results  Labs (all labs ordered are listed, but only abnormal results are displayed) Labs Reviewed  POCT INFLUENZA A/B - Abnormal; Notable for the following components:      Result Value   Influenza A, POC Positive (*)    All other components within normal limits    EKG   Radiology No results found.  Procedures Procedures (including critical care time)  Medications Ordered in UC Medications - No data to display  Initial Impression / Assessment and Plan / UC Course  I have reviewed the triage vital signs and the nursing notes.  Pertinent labs & imaging results that were available during my care of the patient were reviewed by me and considered in my medical decision making (see chart for details).    Flu screening positive in office.  Tamiflu  and Zofran  prescribed.  Advised symptomatic treatment otherwise,  increase fluids and rest with follow-up if no gradual improvement or with any further concerns.  Final Clinical Impressions(s) / UC Diagnoses   Final diagnoses:  Influenza A   Discharge Instructions   None    ED Prescriptions     Medication Sig Dispense Auth. Provider   oseltamivir  (TAMIFLU ) 75 MG capsule Take 1 capsule (75 mg total) by mouth every 12 (twelve) hours. 10 capsule Billy Asberry FALCON, PA-C   ondansetron  (ZOFRAN -ODT) 4 MG disintegrating tablet Take 1 tablet (4 mg total) by mouth every 8 (eight) hours as needed. 20 tablet Billy Asberry FALCON, PA-C      PDMP not reviewed this encounter.   Billy Asberry FALCON, PA-C 10/15/23 1256

## 2023-12-24 ENCOUNTER — Ambulatory Visit (INDEPENDENT_AMBULATORY_CARE_PROVIDER_SITE_OTHER): Admitting: Family Medicine

## 2023-12-24 ENCOUNTER — Other Ambulatory Visit (HOSPITAL_COMMUNITY)
Admission: RE | Admit: 2023-12-24 | Discharge: 2023-12-24 | Disposition: A | Discharge status: Home / Self Care | Source: Ambulatory Visit | Attending: Family Medicine | Admitting: Family Medicine

## 2023-12-24 ENCOUNTER — Encounter: Payer: Self-pay | Admitting: Family Medicine

## 2023-12-24 VITALS — BP 121/86 | HR 82 | Ht 64.0 in | Wt 148.7 lb

## 2023-12-24 DIAGNOSIS — R454 Irritability and anger: Secondary | ICD-10-CM | POA: Diagnosis not present

## 2023-12-24 DIAGNOSIS — N898 Other specified noninflammatory disorders of vagina: Secondary | ICD-10-CM | POA: Insufficient documentation

## 2023-12-24 DIAGNOSIS — Z8572 Personal history of non-Hodgkin lymphomas: Secondary | ICD-10-CM

## 2023-12-24 DIAGNOSIS — E559 Vitamin D deficiency, unspecified: Secondary | ICD-10-CM

## 2023-12-24 DIAGNOSIS — E611 Iron deficiency: Secondary | ICD-10-CM | POA: Diagnosis not present

## 2023-12-24 DIAGNOSIS — Z9049 Acquired absence of other specified parts of digestive tract: Secondary | ICD-10-CM | POA: Diagnosis not present

## 2023-12-24 DIAGNOSIS — Z0000 Encounter for general adult medical examination without abnormal findings: Secondary | ICD-10-CM

## 2023-12-24 DIAGNOSIS — R7301 Impaired fasting glucose: Secondary | ICD-10-CM | POA: Diagnosis not present

## 2023-12-24 LAB — POCT URINALYSIS DIP (CLINITEK)
Bilirubin, UA: NEGATIVE
Glucose, UA: NEGATIVE mg/dL
Ketones, POC UA: NEGATIVE mg/dL
Nitrite, UA: NEGATIVE
POC PROTEIN,UA: NEGATIVE
Spec Grav, UA: 1.015 (ref 1.010–1.025)
Urobilinogen, UA: 0.2 U/dL
pH, UA: 7 (ref 5.0–8.0)

## 2023-12-24 NOTE — Progress Notes (Signed)
   Annual physical  Subjective    Patient ID: Keari D [place], female    DOB: September 05, 1986  Age: 38 y.o. MRN: [id number]  Chief Complaint  Patient presents with   Annual Exam   HPI Mikayla Salazar is a 38 y.o. old female here  for annual exam.   Work:day care.   Relationship:in a relationship Children:81 year old girl Tobacco:no Alcohol:no Recreational drugs:no Menstruation:regular.  Not on bc.   Diet:regular diet.  Ea ts healthy - fruits/vegetables.  Exercise:walks - daily.   Family history of breast, ovarian, endometrial, colorectal cancer:father - colon cancer -  56 years old.    Pgm - pancreatic cancer.    HPI  Separate, acute concerns today:  Patient complains that she thinks when her blood sugar gets low she gets irritable.  Occurs a few hours after eating.  Also complains of "tingling" in her feet and vision changes including blurry vision and stars.  When she eats symptoms go away.  Has a history of colectomy due to Burkitt's lymphoma.   The ASCVD Risk score (Arnett DK, et al., 2019) failed to calculate for the following reasons:   The 2019 ASCVD risk score is only valid for ages 64 to 46  Health Maintenance Due  Topic Date Due   HIV Screening  Never done   Hepatitis C Screening  Never done   Pneumococcal Vaccine 43-41 Years old (1 of 2 - PCV) Never done   COVID-19 Vaccine (3 - Pfizer risk series) 08/18/2020      Objective:     BP 121/86   Pulse 82   Ht 5\' 4"  (1.626 m)   Wt 148 lb 11.2 oz (67.4 kg)   LMP 12/17/2023   SpO2 99%   BMI 25.52 kg/m    Physical Exam General: Alert, oriented HEENT: PERRLA, EOMI CV: Regular rhythm Pulmonary: Lungs clear bilaterally GI: Soft, normal bowel sounds MSK: Strength equal bilaterally Psych: Pleasant affect    Assessment & Plan:   Physical exam, annual  Irritability Assessment & Plan: Patient complaining of irritability, vision changes and paresthesias if she goes a few hours without eating.  Improved by eating.   Patient symptoms concerning for hypoglycemia but blood sugar readings do not correlate with this.  Follow-up lab testing.  Consider ordering continuous glucose monitor.  Orders: -     CBC  History of Burkitt's lymphoma  History of partial colectomy -     CBC -     Comprehensive metabolic panel with GFR -     Hemoglobin A1c -     Lipid panel -     TSH -     Iron, TIBC and Ferritin Panel -     B12 and Folate Panel  Vitamin D  deficiency  Vaginal itching Assessment & Plan: Getting urine dipstick and testing for BV, candidiasis.  Orders: -     POCT URINALYSIS DIP (CLINITEK) -     Cervicovaginal ancillary only -     Urine Culture; Future     Return in about 4 weeks (around 01/21/2024) for Irritability.    Laneta Pintos, MD

## 2023-12-24 NOTE — Patient Instructions (Signed)
 It was nice to see you today,  We addressed the following topics today: I am going to order some blood test for you.  Also the urine test and the swab.  We will let you know the results when we get it. - I would like to see you back in about a month for follow-up of the irritability  Have a great day,  Etha Henle, MD

## 2023-12-25 ENCOUNTER — Encounter: Payer: Self-pay | Admitting: Family Medicine

## 2023-12-25 ENCOUNTER — Other Ambulatory Visit: Payer: Self-pay | Admitting: Family Medicine

## 2023-12-25 LAB — HEMOGLOBIN A1C
Est. average glucose Bld gHb Est-mCnc: 117 mg/dL
Hgb A1c MFr Bld: 5.7 % — ABNORMAL HIGH (ref 4.8–5.6)

## 2023-12-25 LAB — COMPREHENSIVE METABOLIC PANEL WITH GFR
ALT: 18 IU/L (ref 0–32)
AST: 18 IU/L (ref 0–40)
Albumin: 4.5 g/dL (ref 3.9–4.9)
Alkaline Phosphatase: 75 IU/L (ref 44–121)
BUN/Creatinine Ratio: 20 (ref 9–23)
BUN: 12 mg/dL (ref 6–20)
Bilirubin Total: 0.4 mg/dL (ref 0.0–1.2)
CO2: 24 mmol/L (ref 20–29)
Calcium: 9.6 mg/dL (ref 8.7–10.2)
Chloride: 100 mmol/L (ref 96–106)
Creatinine, Ser: 0.61 mg/dL (ref 0.57–1.00)
Globulin, Total: 1.8 g/dL (ref 1.5–4.5)
Glucose: 86 mg/dL (ref 70–99)
Potassium: 3.7 mmol/L (ref 3.5–5.2)
Sodium: 140 mmol/L (ref 134–144)
Total Protein: 6.3 g/dL (ref 6.0–8.5)
eGFR: 119 mL/min/{1.73_m2} (ref >59)

## 2023-12-25 LAB — CERVICOVAGINAL ANCILLARY ONLY
Bacterial Vaginitis (gardnerella): POSITIVE — AB
Candida Glabrata: NEGATIVE
Candida Vaginitis: NEGATIVE
Comment: NEGATIVE
Comment: NEGATIVE
Comment: NEGATIVE
Comment: NEGATIVE
Trichomonas: NEGATIVE

## 2023-12-25 LAB — CBC
Hematocrit: 44 % (ref 34.0–46.6)
Hemoglobin: 14.7 g/dL (ref 11.1–15.9)
MCH: 31.2 pg (ref 26.6–33.0)
MCHC: 33.4 g/dL (ref 31.5–35.7)
MCV: 93 fL (ref 79–97)
Platelets: 343 10*3/uL (ref 150–450)
RBC: 4.71 x10E6/uL (ref 3.77–5.28)
RDW: 12 % (ref 11.7–15.4)
WBC: 12.9 10*3/uL — ABNORMAL HIGH (ref 3.4–10.8)

## 2023-12-25 LAB — IRON,TIBC AND FERRITIN PANEL
Ferritin: 95 ng/mL (ref 15–150)
Iron Saturation: 34 % (ref 15–55)
Iron: 116 ug/dL (ref 27–159)
Total Iron Binding Capacity: 345 ug/dL (ref 250–450)
UIBC: 229 ug/dL (ref 131–425)

## 2023-12-25 LAB — LIPID PANEL
Chol/HDL Ratio: 2.5 ratio (ref 0.0–4.4)
Cholesterol, Total: 154 mg/dL (ref 100–199)
HDL: 61 mg/dL (ref >39)
LDL Chol Calc (NIH): 84 mg/dL (ref 0–99)
Triglycerides: 41 mg/dL (ref 0–149)
VLDL Cholesterol Cal: 9 mg/dL (ref 5–40)

## 2023-12-25 LAB — TSH: TSH: 1.04 u[IU]/mL (ref 0.450–4.500)

## 2023-12-25 LAB — B12 AND FOLATE PANEL
Folate: 15.3 ng/mL (ref >3.0)
Vitamin B-12: 474 pg/mL (ref 232–1245)

## 2023-12-25 MED ORDER — METRONIDAZOLE 500 MG PO TABS: 500.0000 mg | ORAL_TABLET | Freq: Two times a day (BID) | ORAL | 0 refills | Status: AC

## 2023-12-26 LAB — URINE CULTURE

## 2023-12-29 DIAGNOSIS — N898 Other specified noninflammatory disorders of vagina: Secondary | ICD-10-CM | POA: Insufficient documentation

## 2023-12-29 DIAGNOSIS — R454 Irritability and anger: Secondary | ICD-10-CM | POA: Insufficient documentation

## 2023-12-29 NOTE — Assessment & Plan Note (Signed)
 Getting urine dipstick and testing for BV, candidiasis.

## 2023-12-29 NOTE — Assessment & Plan Note (Signed)
 Patient complaining of irritability, vision changes and paresthesias if she goes a few hours without eating.  Improved by eating.  Patient symptoms concerning for hypoglycemia but blood sugar readings do not correlate with this.  Follow-up lab testing.  Consider ordering continuous glucose monitor.

## 2024-02-08 DIAGNOSIS — R059 Cough, unspecified: Secondary | ICD-10-CM | POA: Diagnosis not present

## 2024-02-08 DIAGNOSIS — H1033 Unspecified acute conjunctivitis, bilateral: Secondary | ICD-10-CM | POA: Diagnosis not present

## 2024-02-08 DIAGNOSIS — R0981 Nasal congestion: Secondary | ICD-10-CM | POA: Diagnosis not present

## 2024-02-08 IMAGING — US US THYROID
1 series · 14 of 25 positions shown · non-contrast
Comparison: None.

CLINICAL DATA: Thyromegaly

EXAM:
THYROID ULTRASOUND
TECHNIQUE: Ultrasound examination of the thyroid gland and adjacent soft
tissues was performed.

[Series 1: us thyroid · 0.07mm/px · 14 of 35 slices shown]
[im 1/35]
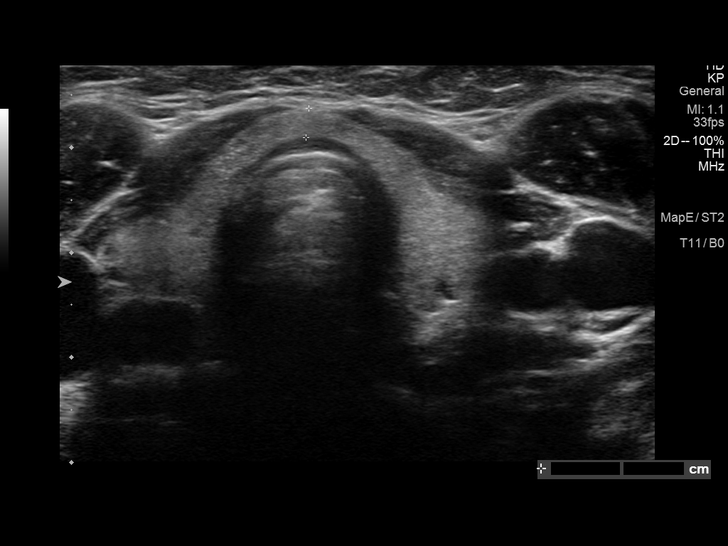
[im 3/35]
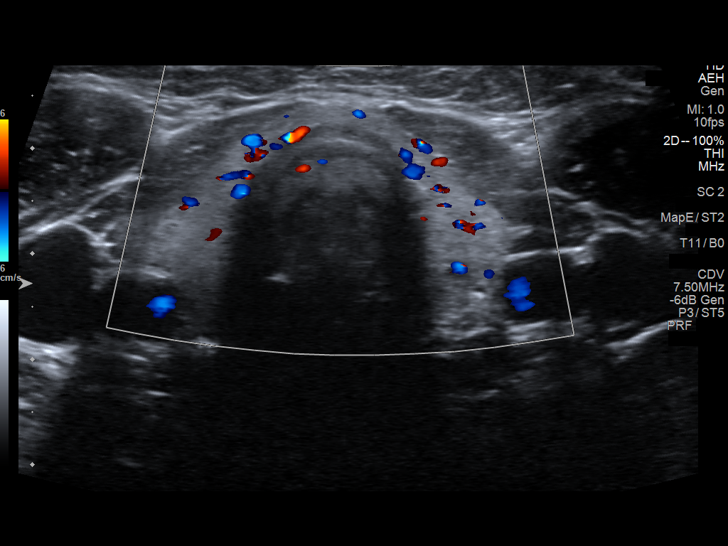
[im 6/35]
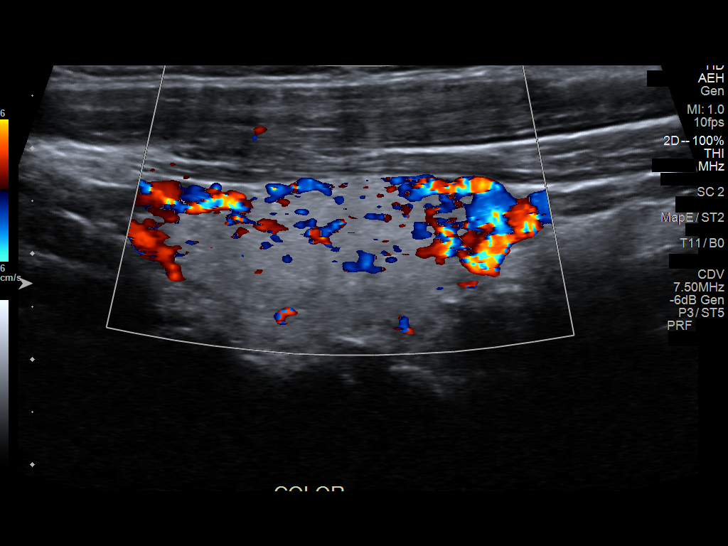
[im 9/35]
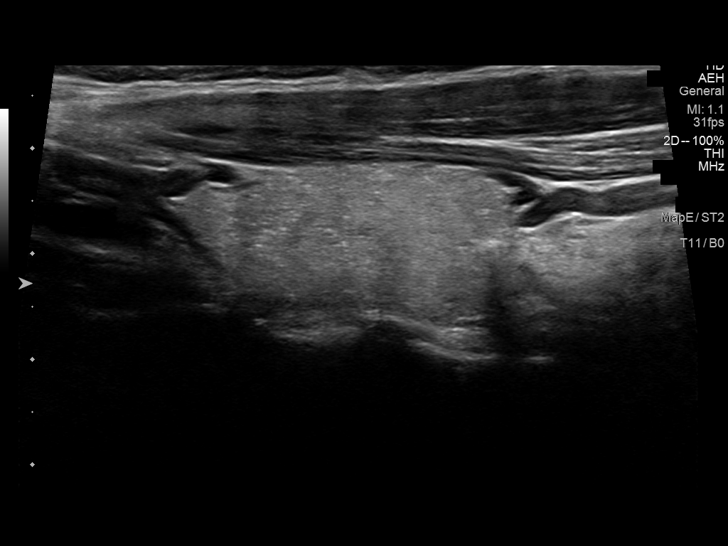
[im 12/35]
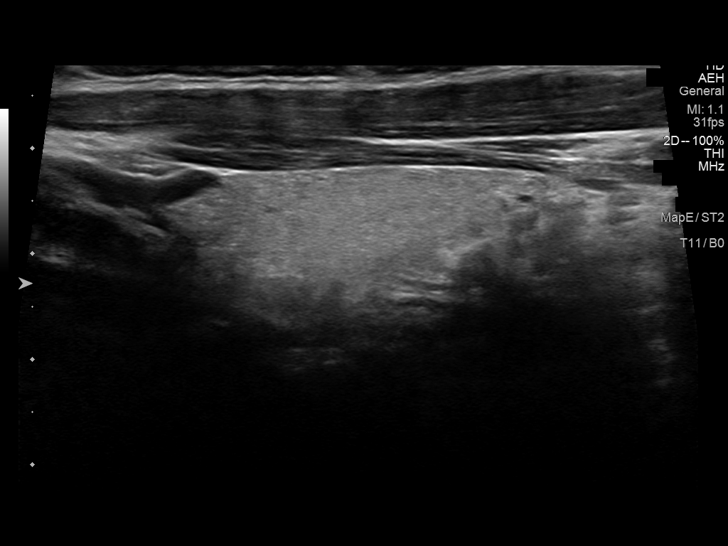
[im 13/35]
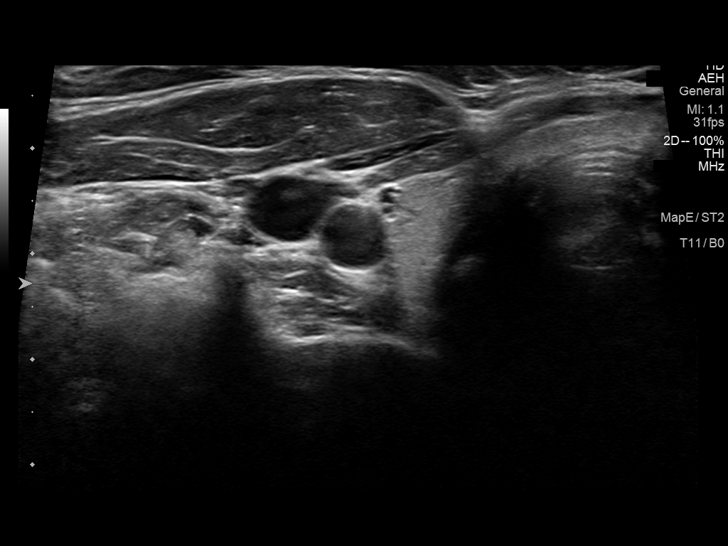
[im 16/35]
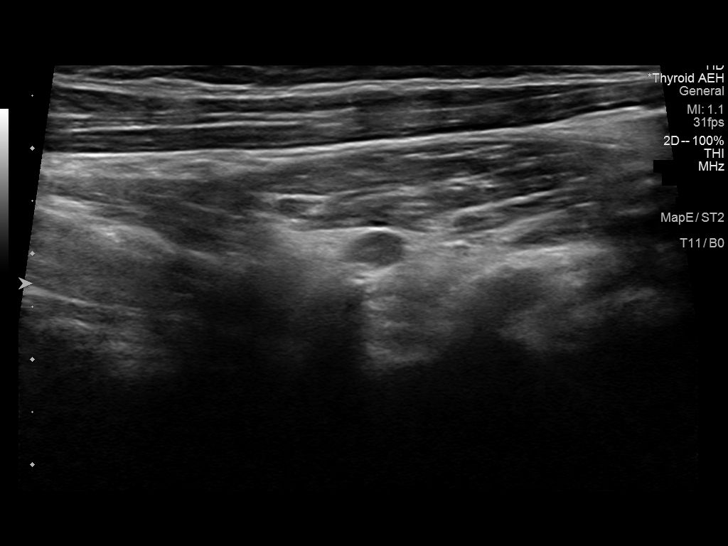
[im 19/35]
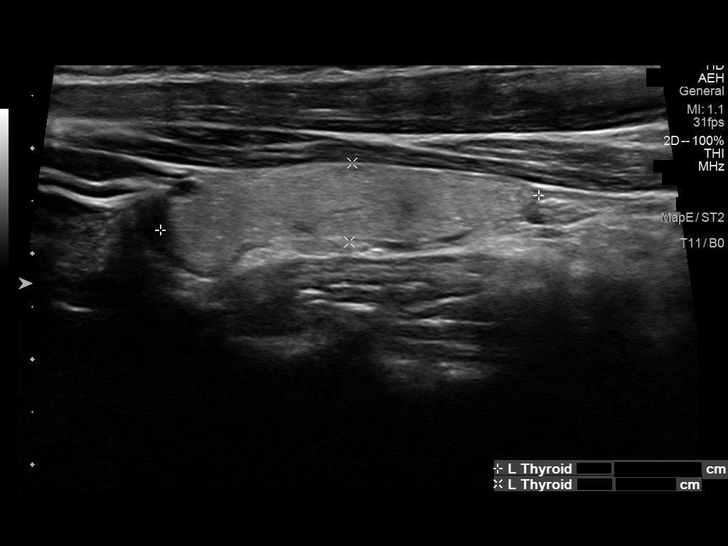
[im 22/35]
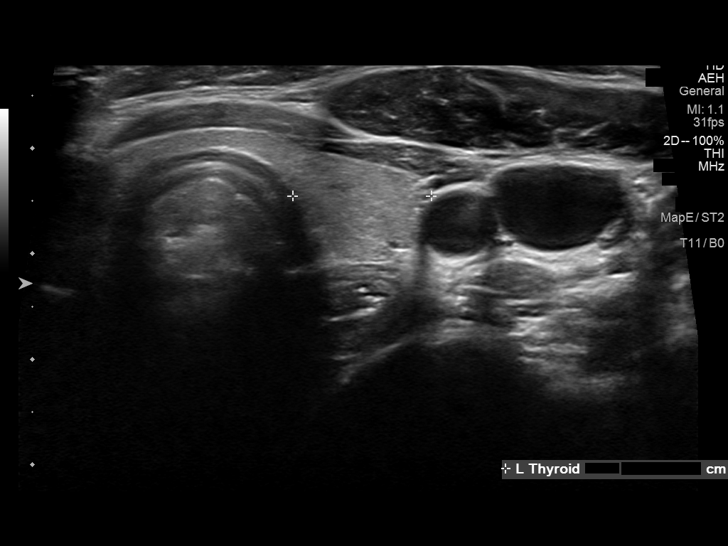
[im 23/35]
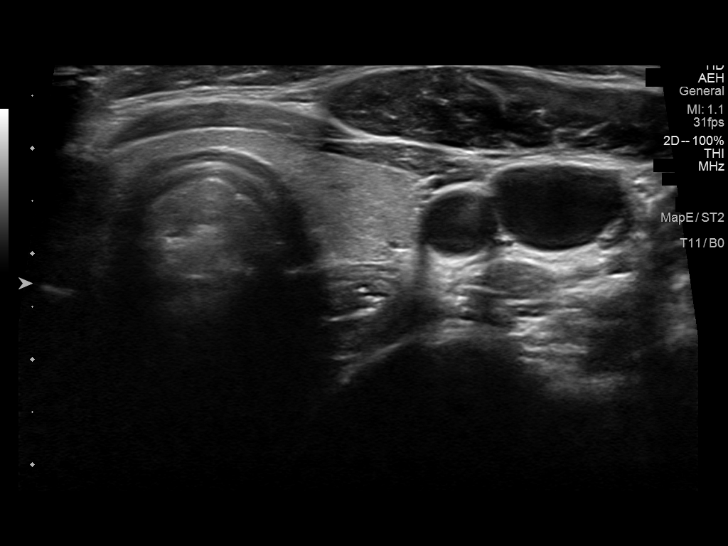
[im 26/35]
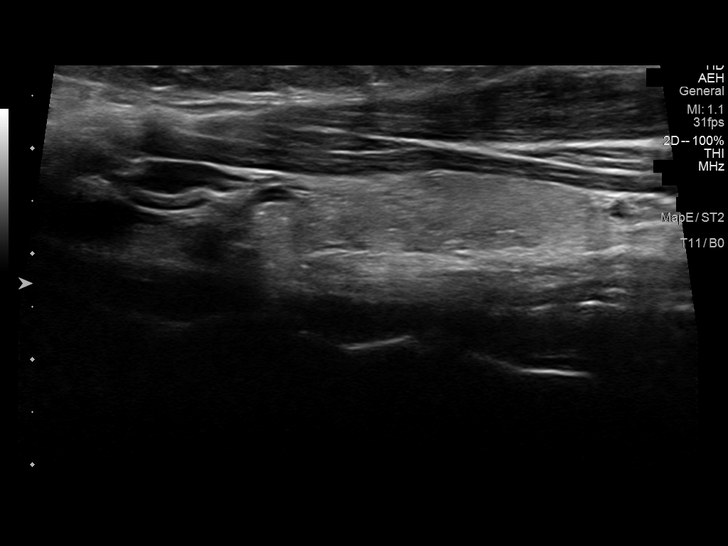
[im 29/35]
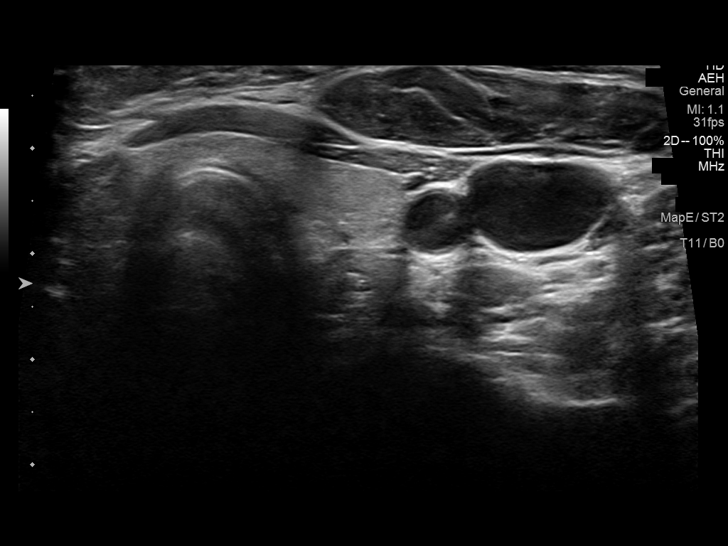
[im 32/35]
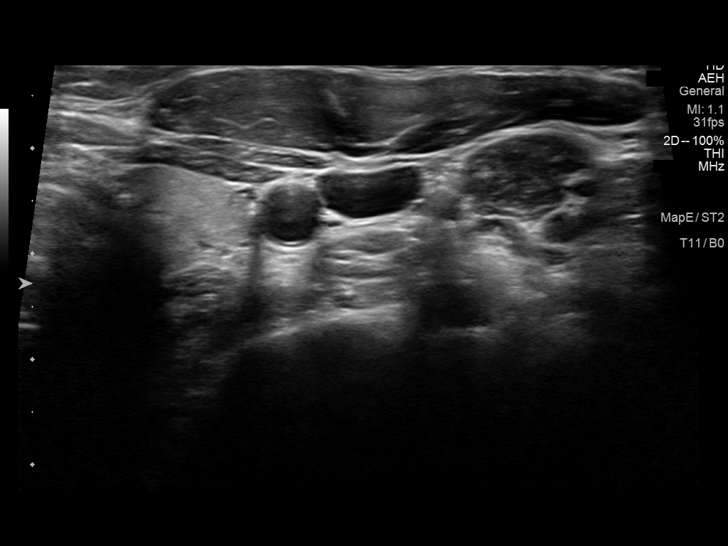
[im 35/35]
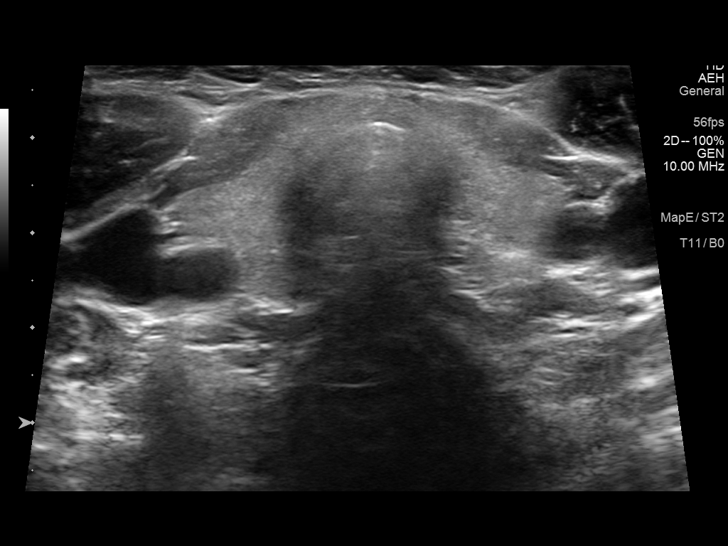

[14 of 25 positions shown; findings below may reference images not displayed]

FINDINGS: Parenchymal Echotexture: Normal

Isthmus: 0.3 cm

Right lobe: 3.2 x 1.3 x 1.1 cm

Left lobe: 3.6 x 0.8 x 1.3 cm

_________________________________________________________

Estimated total number of nodules >/= 1 cm: 0

Number of spongiform nodules >/=  2 cm not described below (TR1): 0

Number of mixed cystic and solid nodules >/= 1.5 cm not described
below (TR2): 0

_________________________________________________________

No discrete nodules are seen within the thyroid gland.
IMPRESSION: Normal thyroid ultrasound.

The above is in keeping with the ACR TI-RADS recommendations - [HOSPITAL] 4760;[DATE].

## 2024-02-09 ENCOUNTER — Ambulatory Visit: Payer: Self-pay

## 2024-08-25 DIAGNOSIS — N760 Acute vaginitis: Secondary | ICD-10-CM | POA: Diagnosis not present

## 2024-08-25 DIAGNOSIS — N926 Irregular menstruation, unspecified: Secondary | ICD-10-CM | POA: Diagnosis not present

## 2024-08-25 DIAGNOSIS — N771 Vaginitis, vulvitis and vulvovaginitis in diseases classified elsewhere: Secondary | ICD-10-CM | POA: Diagnosis not present

## 2024-08-25 DIAGNOSIS — Z6825 Body mass index (BMI) 25.0-25.9, adult: Secondary | ICD-10-CM | POA: Diagnosis not present

## 2024-08-25 DIAGNOSIS — L689 Hypertrichosis, unspecified: Secondary | ICD-10-CM | POA: Diagnosis not present

## 2024-08-25 DIAGNOSIS — Z0000 Encounter for general adult medical examination without abnormal findings: Secondary | ICD-10-CM | POA: Diagnosis not present
